# Patient Record
Sex: Female | Born: 1985
Health system: Southern US, Community
[De-identification: ages and names within clinical notes are randomized; demographics above are authoritative.]

## PROBLEM LIST (undated history)

## (undated) DIAGNOSIS — R7611 Nonspecific reaction to tuberculin skin test without active tuberculosis: Secondary | ICD-10-CM

## (undated) DIAGNOSIS — Z789 Other specified health status: Secondary | ICD-10-CM

## (undated) DIAGNOSIS — Z8489 Family history of other specified conditions: Secondary | ICD-10-CM

## (undated) HISTORY — DX: Nonspecific reaction to tuberculin skin test without active tuberculosis: R76.11

## (undated) HISTORY — DX: Family history of other specified conditions: Z84.89

## (undated) HISTORY — PX: NO PAST SURGERIES: SHX2092

## (undated) HISTORY — PX: CYSTECTOMY: SUR359

---
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
## 2004-02-24 ENCOUNTER — Ambulatory Visit: Payer: Self-pay | Admitting: Family Medicine

## 2004-06-23 ENCOUNTER — Ambulatory Visit: Payer: Self-pay | Admitting: Family Medicine

## 2004-08-19 ENCOUNTER — Emergency Department (HOSPITAL_COMMUNITY): Admission: EM | Admit: 2004-08-19 | Discharge: 2004-08-19 | Payer: Self-pay | Admitting: Emergency Medicine

## 2004-08-20 ENCOUNTER — Ambulatory Visit (HOSPITAL_COMMUNITY): Admission: RE | Admit: 2004-08-20 | Discharge: 2004-08-20 | Payer: Self-pay | Admitting: Emergency Medicine

## 2004-09-01 ENCOUNTER — Ambulatory Visit: Payer: Self-pay | Admitting: Family Medicine

## 2004-09-25 ENCOUNTER — Emergency Department (HOSPITAL_COMMUNITY): Admission: EM | Admit: 2004-09-25 | Discharge: 2004-09-25 | Payer: Self-pay | Admitting: Family Medicine

## 2004-09-26 ENCOUNTER — Ambulatory Visit: Payer: Self-pay | Admitting: Family Medicine

## 2004-11-13 ENCOUNTER — Emergency Department (HOSPITAL_COMMUNITY): Admission: EM | Admit: 2004-11-13 | Discharge: 2004-11-13 | Payer: Self-pay | Admitting: Family Medicine

## 2005-01-02 ENCOUNTER — Ambulatory Visit: Payer: Self-pay | Admitting: *Deleted

## 2005-04-03 ENCOUNTER — Ambulatory Visit: Payer: Self-pay | Admitting: Obstetrics & Gynecology

## 2005-04-04 ENCOUNTER — Ambulatory Visit (HOSPITAL_COMMUNITY): Admission: RE | Admit: 2005-04-04 | Discharge: 2005-04-04 | Payer: Self-pay | Admitting: *Deleted

## 2005-04-09 ENCOUNTER — Encounter (INDEPENDENT_AMBULATORY_CARE_PROVIDER_SITE_OTHER): Payer: Self-pay | Admitting: *Deleted

## 2005-04-09 LAB — CONVERTED CEMR LAB

## 2005-05-12 ENCOUNTER — Emergency Department (HOSPITAL_COMMUNITY): Admission: EM | Admit: 2005-05-12 | Discharge: 2005-05-12 | Payer: Self-pay | Admitting: Emergency Medicine

## 2005-05-22 ENCOUNTER — Ambulatory Visit: Payer: Self-pay | Admitting: Sports Medicine

## 2005-06-07 ENCOUNTER — Ambulatory Visit: Payer: Self-pay | Admitting: Family Medicine

## 2005-09-07 ENCOUNTER — Ambulatory Visit: Payer: Self-pay | Admitting: Family Medicine

## 2005-09-20 ENCOUNTER — Ambulatory Visit: Payer: Self-pay | Admitting: Family Medicine

## 2005-10-16 ENCOUNTER — Emergency Department (HOSPITAL_COMMUNITY): Admission: EM | Admit: 2005-10-16 | Discharge: 2005-10-16 | Payer: Self-pay | Admitting: Family Medicine

## 2005-11-05 ENCOUNTER — Ambulatory Visit: Payer: Self-pay | Admitting: Sports Medicine

## 2006-06-07 ENCOUNTER — Encounter (INDEPENDENT_AMBULATORY_CARE_PROVIDER_SITE_OTHER): Payer: Self-pay | Admitting: *Deleted

## 2006-10-19 ENCOUNTER — Emergency Department (HOSPITAL_COMMUNITY): Admission: EM | Admit: 2006-10-19 | Discharge: 2006-10-19 | Payer: Self-pay | Admitting: Emergency Medicine

## 2006-10-28 ENCOUNTER — Emergency Department (HOSPITAL_COMMUNITY): Admission: EM | Admit: 2006-10-28 | Discharge: 2006-10-28 | Payer: Self-pay | Admitting: Emergency Medicine

## 2006-12-19 ENCOUNTER — Telehealth (INDEPENDENT_AMBULATORY_CARE_PROVIDER_SITE_OTHER): Payer: Self-pay | Admitting: *Deleted

## 2006-12-20 ENCOUNTER — Ambulatory Visit: Payer: Self-pay | Admitting: Family Medicine

## 2006-12-20 ENCOUNTER — Encounter: Payer: Self-pay | Admitting: Family Medicine

## 2006-12-20 ENCOUNTER — Telehealth: Payer: Self-pay | Admitting: *Deleted

## 2006-12-20 LAB — CONVERTED CEMR LAB
Beta hcg, urine, semiquantitative: NEGATIVE
Bilirubin Urine: NEGATIVE
Blood in Urine, dipstick: NEGATIVE
GC Probe Amp, Genital: NEGATIVE
Glucose, Urine, Semiquant: NEGATIVE
KOH Prep: NEGATIVE
Nitrite: NEGATIVE
Specific Gravity, Urine: 1.02
pH: 7

## 2006-12-27 ENCOUNTER — Telehealth (INDEPENDENT_AMBULATORY_CARE_PROVIDER_SITE_OTHER): Payer: Self-pay | Admitting: *Deleted

## 2007-02-22 ENCOUNTER — Telehealth (INDEPENDENT_AMBULATORY_CARE_PROVIDER_SITE_OTHER): Payer: Self-pay | Admitting: Family Medicine

## 2007-02-24 ENCOUNTER — Ambulatory Visit: Payer: Self-pay | Admitting: Family Medicine

## 2007-02-24 ENCOUNTER — Encounter (INDEPENDENT_AMBULATORY_CARE_PROVIDER_SITE_OTHER): Payer: Self-pay | Admitting: Family Medicine

## 2007-02-24 ENCOUNTER — Telehealth: Payer: Self-pay | Admitting: *Deleted

## 2007-02-24 DIAGNOSIS — N920 Excessive and frequent menstruation with regular cycle: Secondary | ICD-10-CM | POA: Insufficient documentation

## 2007-02-24 LAB — CONVERTED CEMR LAB
Beta hcg, urine, semiquantitative: NEGATIVE
Chlamydia, DNA Probe: NEGATIVE
GC Probe Amp, Genital: NEGATIVE
Glucose, Urine, Semiquant: NEGATIVE
Ketones, urine, test strip: NEGATIVE
Nitrite: NEGATIVE
Platelets: 241 10*3/uL (ref 150–400)
RDW: 13 % (ref 11.5–15.5)
Specific Gravity, Urine: 1.02
WBC: 5.4 10*3/uL (ref 4.0–10.5)
pH: 7

## 2007-03-13 ENCOUNTER — Ambulatory Visit: Payer: Self-pay | Admitting: Family Medicine

## 2007-03-17 ENCOUNTER — Ambulatory Visit (HOSPITAL_COMMUNITY): Admission: RE | Admit: 2007-03-17 | Discharge: 2007-03-17 | Payer: Self-pay | Admitting: Family Medicine

## 2007-03-18 ENCOUNTER — Telehealth (INDEPENDENT_AMBULATORY_CARE_PROVIDER_SITE_OTHER): Payer: Self-pay | Admitting: Family Medicine

## 2007-03-20 ENCOUNTER — Ambulatory Visit: Payer: Self-pay | Admitting: Family Medicine

## 2007-03-26 ENCOUNTER — Telehealth: Payer: Self-pay | Admitting: *Deleted

## 2007-05-09 ENCOUNTER — Telehealth: Payer: Self-pay | Admitting: *Deleted

## 2007-05-14 ENCOUNTER — Ambulatory Visit: Payer: Self-pay | Admitting: Obstetrics & Gynecology

## 2007-06-30 ENCOUNTER — Encounter: Payer: Self-pay | Admitting: Obstetrics & Gynecology

## 2007-06-30 ENCOUNTER — Ambulatory Visit: Payer: Self-pay | Admitting: Obstetrics & Gynecology

## 2007-06-30 ENCOUNTER — Ambulatory Visit (HOSPITAL_COMMUNITY): Admission: RE | Admit: 2007-06-30 | Discharge: 2007-06-30 | Payer: Self-pay | Admitting: Obstetrics & Gynecology

## 2007-07-07 ENCOUNTER — Inpatient Hospital Stay (HOSPITAL_COMMUNITY): Admission: AD | Admit: 2007-07-07 | Discharge: 2007-07-07 | Payer: Self-pay | Admitting: Gynecology

## 2007-08-11 ENCOUNTER — Ambulatory Visit: Payer: Self-pay | Admitting: Sports Medicine

## 2007-08-11 DIAGNOSIS — N840 Polyp of corpus uteri: Secondary | ICD-10-CM | POA: Insufficient documentation

## 2007-08-11 DIAGNOSIS — L259 Unspecified contact dermatitis, unspecified cause: Secondary | ICD-10-CM | POA: Insufficient documentation

## 2007-08-11 DIAGNOSIS — L708 Other acne: Secondary | ICD-10-CM | POA: Insufficient documentation

## 2007-09-04 ENCOUNTER — Ambulatory Visit: Payer: Self-pay | Admitting: Obstetrics & Gynecology

## 2007-09-09 ENCOUNTER — Emergency Department (HOSPITAL_COMMUNITY): Admission: EM | Admit: 2007-09-09 | Discharge: 2007-09-09 | Payer: Self-pay | Admitting: Family Medicine

## 2007-09-18 ENCOUNTER — Emergency Department (HOSPITAL_COMMUNITY): Admission: EM | Admit: 2007-09-18 | Discharge: 2007-09-18 | Payer: Self-pay | Admitting: Emergency Medicine

## 2007-09-30 ENCOUNTER — Encounter: Payer: Self-pay | Admitting: *Deleted

## 2007-10-08 ENCOUNTER — Encounter: Payer: Self-pay | Admitting: Family Medicine

## 2007-10-08 ENCOUNTER — Ambulatory Visit: Payer: Self-pay | Admitting: Family Medicine

## 2008-01-30 ENCOUNTER — Ambulatory Visit: Payer: Self-pay | Admitting: Family Medicine

## 2008-01-31 ENCOUNTER — Inpatient Hospital Stay (HOSPITAL_COMMUNITY): Admission: AD | Admit: 2008-01-31 | Discharge: 2008-02-01 | Payer: Self-pay | Admitting: Obstetrics & Gynecology

## 2008-02-06 ENCOUNTER — Ambulatory Visit: Payer: Self-pay | Admitting: Obstetrics & Gynecology

## 2008-03-18 ENCOUNTER — Ambulatory Visit (HOSPITAL_COMMUNITY): Admission: RE | Admit: 2008-03-18 | Discharge: 2008-03-18 | Payer: Self-pay | Admitting: Obstetrics and Gynecology

## 2008-05-05 ENCOUNTER — Telehealth (INDEPENDENT_AMBULATORY_CARE_PROVIDER_SITE_OTHER): Payer: Self-pay | Admitting: *Deleted

## 2008-05-14 ENCOUNTER — Telehealth: Payer: Self-pay | Admitting: *Deleted

## 2008-05-24 ENCOUNTER — Encounter: Payer: Self-pay | Admitting: Family Medicine

## 2008-05-24 ENCOUNTER — Ambulatory Visit: Payer: Self-pay | Admitting: Family Medicine

## 2008-05-25 ENCOUNTER — Encounter: Payer: Self-pay | Admitting: Family Medicine

## 2008-05-26 ENCOUNTER — Ambulatory Visit: Payer: Self-pay | Admitting: Family Medicine

## 2008-05-26 ENCOUNTER — Encounter: Payer: Self-pay | Admitting: Family Medicine

## 2008-05-26 DIAGNOSIS — R7611 Nonspecific reaction to tuberculin skin test without active tuberculosis: Secondary | ICD-10-CM | POA: Insufficient documentation

## 2008-05-26 HISTORY — DX: Nonspecific reaction to tuberculin skin test without active tuberculosis: R76.11

## 2008-05-26 LAB — CONVERTED CEMR LAB: Varicella IgG: 2.66 — ABNORMAL HIGH

## 2008-05-31 ENCOUNTER — Encounter: Admission: RE | Admit: 2008-05-31 | Discharge: 2008-05-31 | Payer: Self-pay | Admitting: Pulmonary Disease

## 2008-10-04 IMAGING — CR DG CHEST 2V
2 series · 2 of 2 positions shown · non-contrast
Comparison: No priors available.

CLINICAL DATA: 20-year-old with chest pain for one week.  
 CHEST ? 2 VIEW:

[w chest pa]
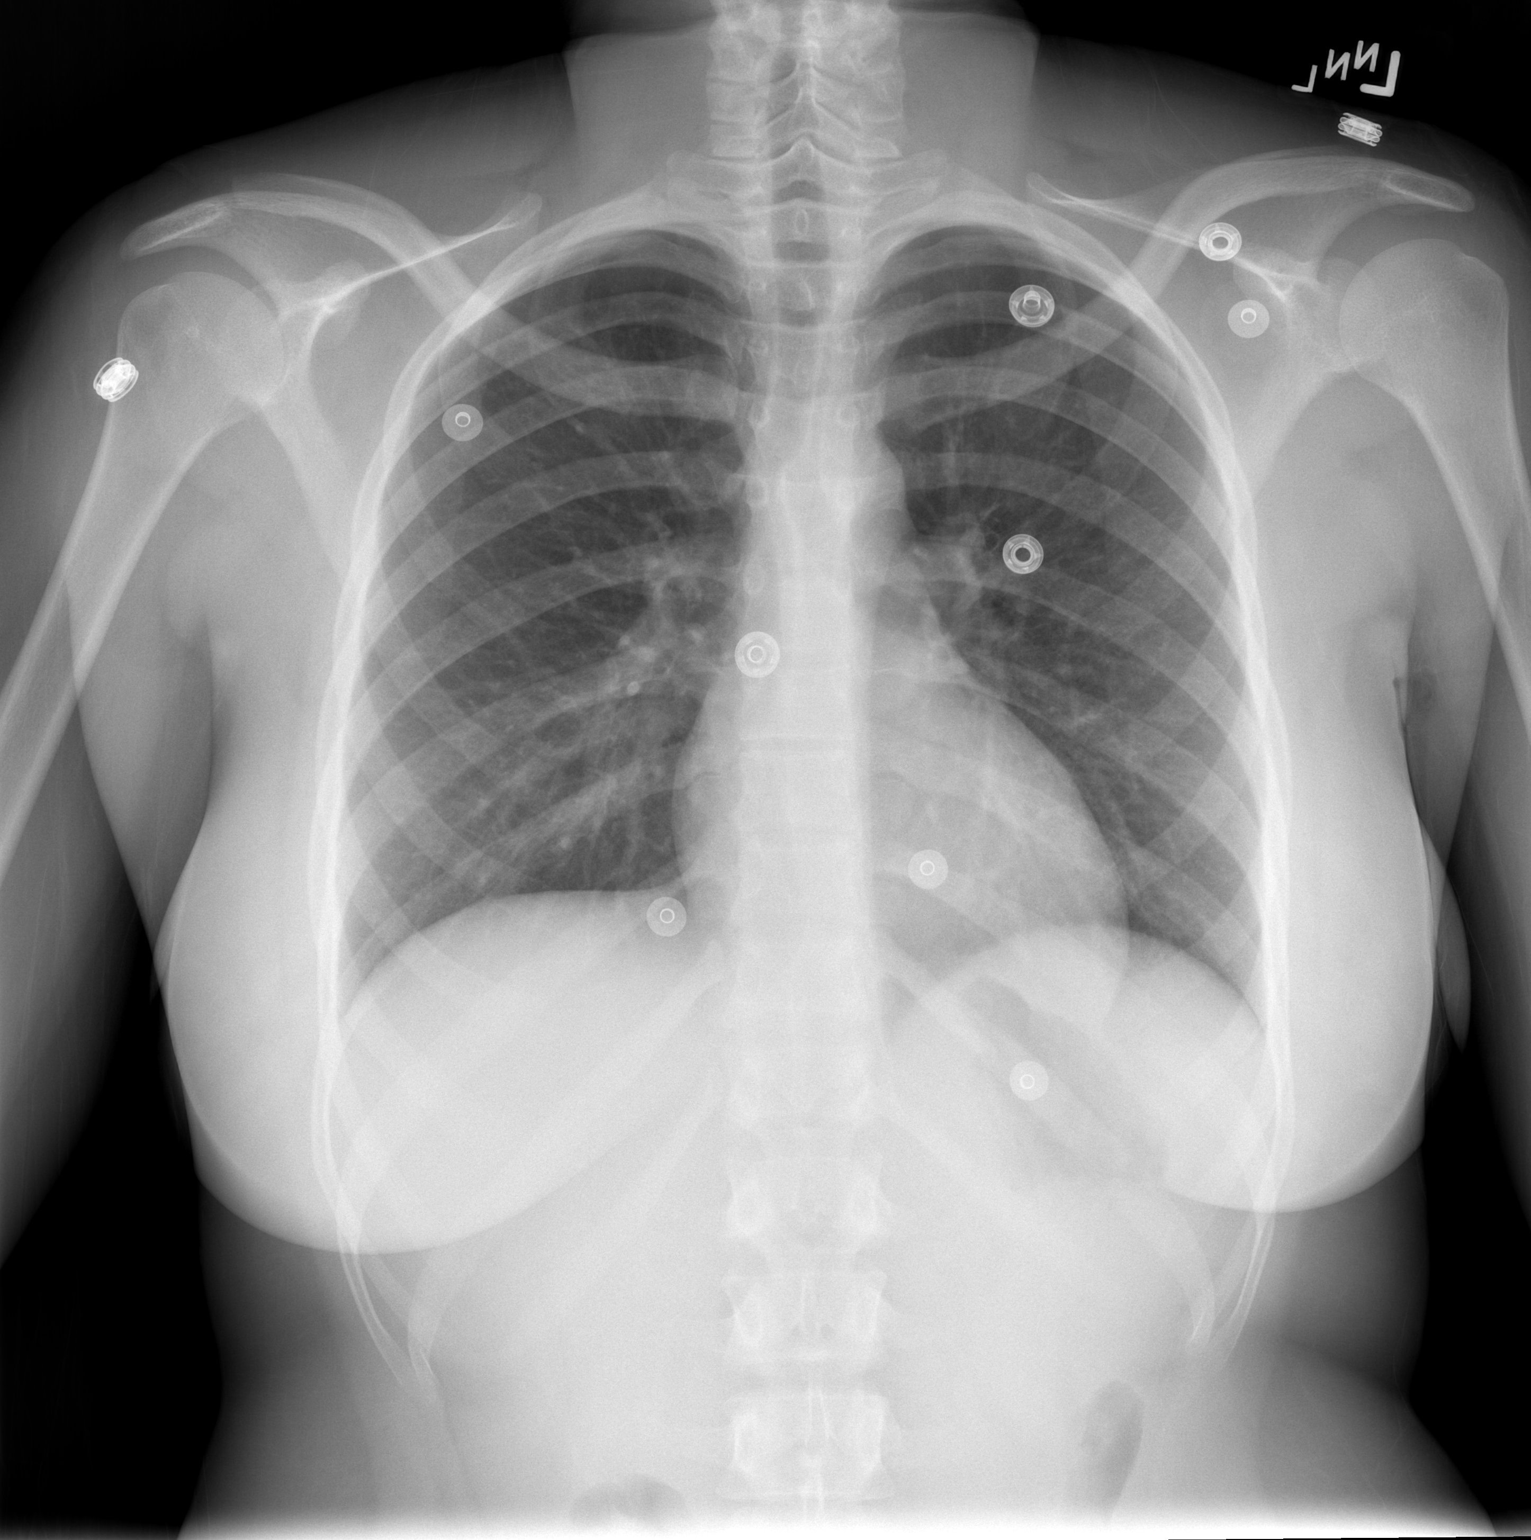

[w chest lat]
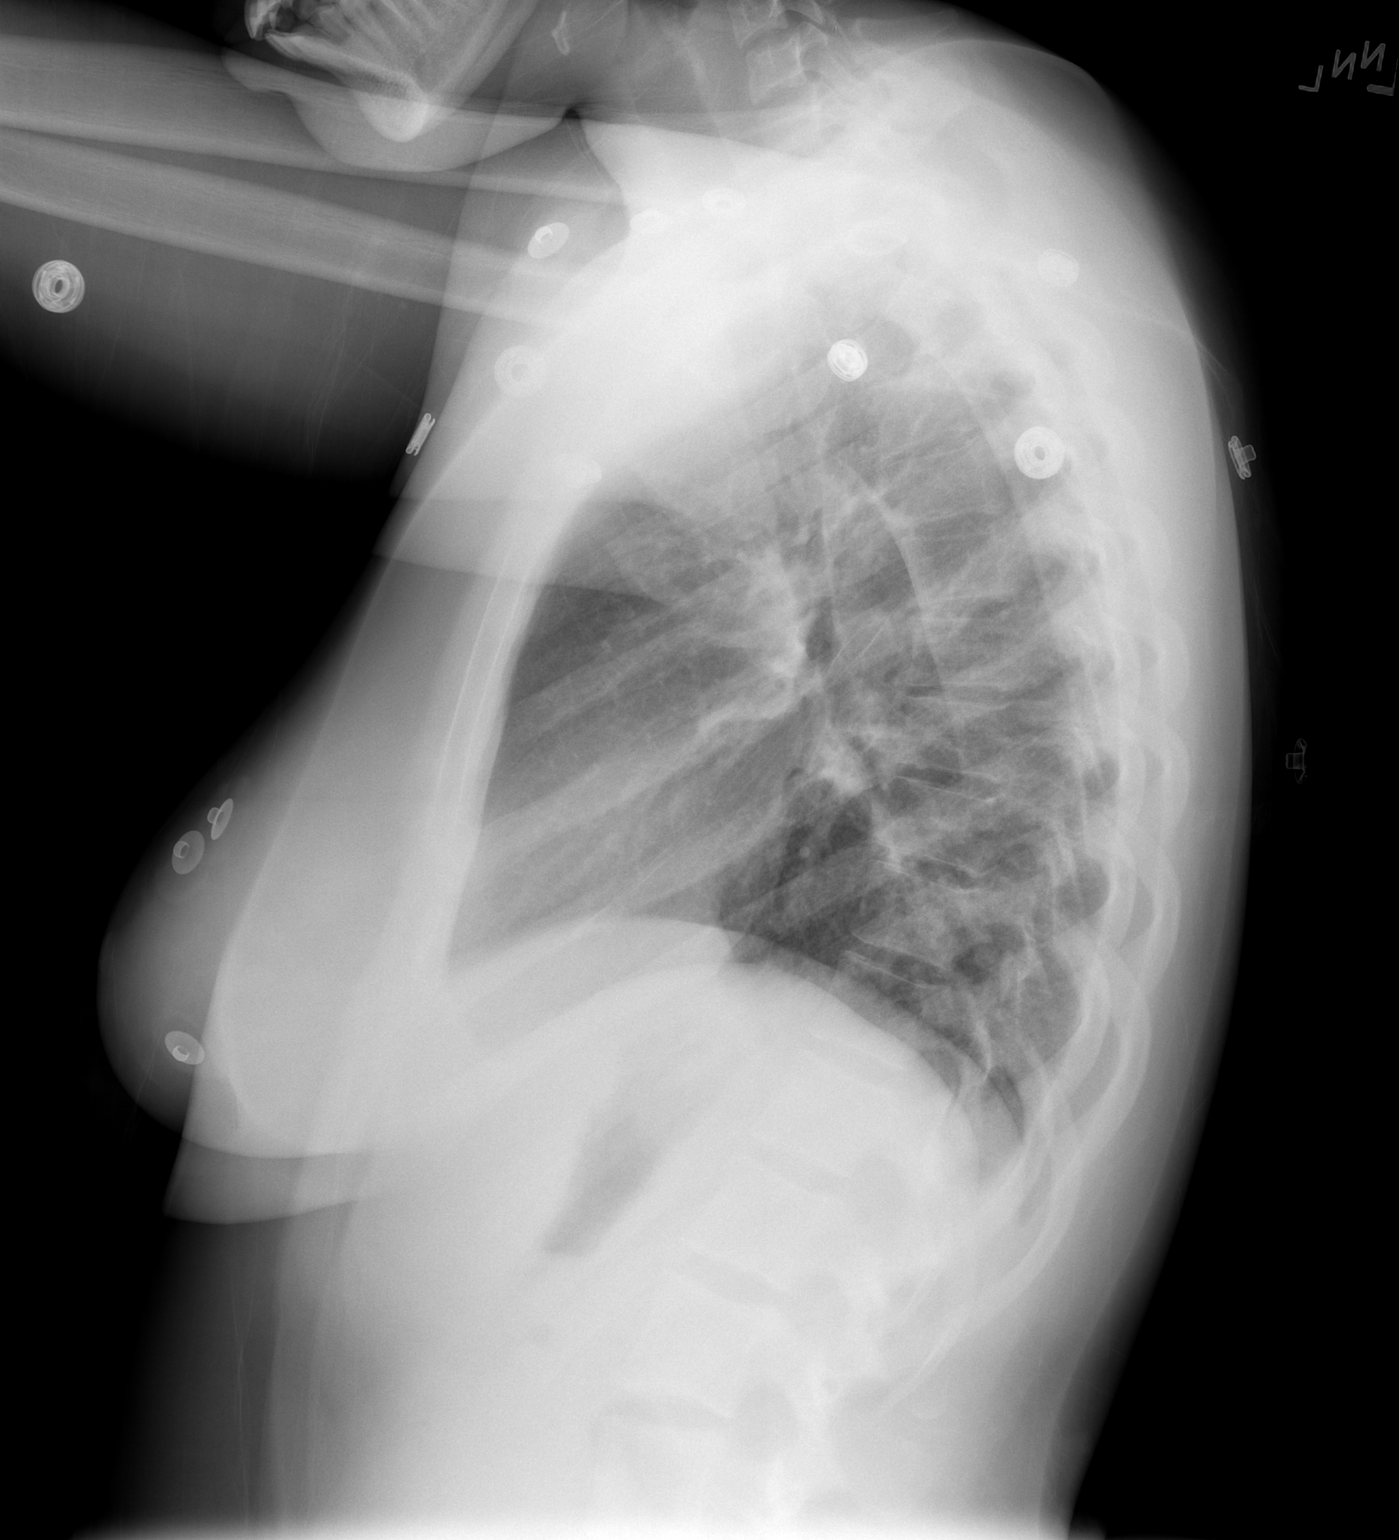

[2 of 2 positions shown; findings below may reference images not displayed]

FINDINGS: There is mild prominence of interstitial markings in the perihilar region consistent with bronchitic changes.  No focal consolidations or pleural effusions are identified.
IMPRESSION: Mild bronchitic changes.

## 2008-10-19 ENCOUNTER — Encounter: Payer: Self-pay | Admitting: Family Medicine

## 2009-01-05 ENCOUNTER — Telehealth: Payer: Self-pay | Admitting: Family Medicine

## 2009-01-05 ENCOUNTER — Ambulatory Visit: Payer: Self-pay | Admitting: Family Medicine

## 2009-01-10 ENCOUNTER — Telehealth (INDEPENDENT_AMBULATORY_CARE_PROVIDER_SITE_OTHER): Payer: Self-pay | Admitting: *Deleted

## 2009-03-07 ENCOUNTER — Ambulatory Visit: Payer: Self-pay | Admitting: Family Medicine

## 2009-03-29 ENCOUNTER — Emergency Department (HOSPITAL_COMMUNITY): Admission: EM | Admit: 2009-03-29 | Discharge: 2009-03-29 | Payer: Self-pay | Admitting: Family Medicine

## 2009-04-10 ENCOUNTER — Emergency Department (HOSPITAL_COMMUNITY): Admission: EM | Admit: 2009-04-10 | Discharge: 2009-04-10 | Payer: Self-pay | Admitting: Emergency Medicine

## 2009-04-12 ENCOUNTER — Telehealth: Payer: Self-pay | Admitting: Family Medicine

## 2009-04-15 ENCOUNTER — Ambulatory Visit: Payer: Self-pay | Admitting: Family Medicine

## 2009-06-20 ENCOUNTER — Encounter: Payer: Self-pay | Admitting: Family Medicine

## 2009-07-09 ENCOUNTER — Emergency Department (HOSPITAL_COMMUNITY): Admission: EM | Admit: 2009-07-09 | Discharge: 2009-07-09 | Payer: Self-pay | Admitting: Emergency Medicine

## 2009-07-15 ENCOUNTER — Ambulatory Visit: Payer: Self-pay | Admitting: Family Medicine

## 2010-02-15 ENCOUNTER — Telehealth: Payer: Self-pay | Admitting: *Deleted

## 2010-02-16 ENCOUNTER — Ambulatory Visit: Payer: Self-pay | Admitting: Family Medicine

## 2010-02-16 LAB — CONVERTED CEMR LAB
Bilirubin Urine: NEGATIVE
Glucose, Urine, Semiquant: NEGATIVE
Specific Gravity, Urine: 1.025
pH: 6

## 2010-03-08 ENCOUNTER — Encounter (INDEPENDENT_AMBULATORY_CARE_PROVIDER_SITE_OTHER): Payer: Self-pay | Admitting: *Deleted

## 2010-04-05 ENCOUNTER — Ambulatory Visit: Admit: 2010-04-05 | Payer: Self-pay

## 2010-05-09 NOTE — Assessment & Plan Note (Signed)
Summary: uti,tcb   Vital Signs:  Patient profile:   25 year old female Weight:      154 pounds Temp:     98.9 degrees F oral Pulse rate:   93 / minute Pulse rhythm:   regular BP sitting:   117 / 78  (left arm) Cuff size:   regular  Vitals Entered By: Loralee Pacas CMA (July 15, 2009 1:44 PM)  Acute Pediatric Visit History:      The patient presents with genitourinary symptoms.  The urinary symptoms has been present for 5 days.  She notes a history of abdominal cramping, dysuria, frequency, and urgency.  She denies abdominal pain, fever, flank pain, or hematuria.  The patient has no history of pyelonephritis, kidney stones, a renal anomaly, renal disease, diabetes, ectopic pregnancy, pelvic inflammatory disease, sexually transmitted disease, immunosuppression, orl contraceptive agent use, or, IUD usage.  Antibiotics have not been used within the last 4 weeks.  She has not had 3 or more urinary tract infections in the last 12 months.  Comments: feeling of incomplete voiding. no systemic symptoms. .        Current Medications (verified): 1)  Ibuprofen 800 Mg  Tabs (Ibuprofen) .Marland Kitchen.. 1 By Mouth Three Times A Day With Food As Needed Cramps 2)  Triamcinolone Acetonide 0.1 %  Oint (Triamcinolone Acetonide) .... Aaa Three Times A Day As Needed Itching 3)  Minocycline Hcl 100 Mg  Caps (Minocycline Hcl) .Marland Kitchen.. 1 By Mouth Two Times A Day For 12 Weeks 4)  Cleocin-T 1 % Lotn (Clindamycin Phosphate) .... Apply To Affected Areas On Face Two Times A Day X 6 Weeks. Dispense 60 Ml 5)  Sumatriptan Succinate 50 Mg Tabs (Sumatriptan Succinate) .... One Tab By Mouth At Once of Headache. May Repeat Every 2 Hours Up To Max of 4 Tablets in 24 Hours.  Allergies (verified): No Known Drug Allergies  Physical Exam  General:  Well-developed,well-nourished,in no acute distress; alert,appropriate and cooperative throughout examination. vitals reviewed.  Abdomen:  Bowel sounds positive,abdomen soft and non-tender without  masses, organomegaly or hernias noted. no CVA tenderness bilaterally   Impression & Recommendations:  Problem # 1:  UTI (ICD-599.0) Assessment New  U/A with LE in addition to patient history. treat with cipro x3 days.   Her updated medication list for this problem includes:    Minocycline Hcl 100 Mg Caps (Minocycline hcl) .Marland Kitchen... 1 by mouth two times a day for 12 weeks    Ciprofloxacin Hcl 500 Mg Tabs (Ciprofloxacin hcl) ..... One tab by mouth two times a day x3 days  Orders: [facility]- Est Level  3 (81191)  Other Orders: Urinalysis-[facility] (00000) Prescriptions: CIPROFLOXACIN HCL 500 MG TABS (CIPROFLOXACIN HCL) one tab by mouth two times a day x3 days  #6 x 0   Entered and Authorized by:   Lequita Asal  MD   Signed by:   Lequita Asal  MD on 07/15/2009   Method used:   Electronically to        [facility] #[id number]* (retail)       [address] [place], [place]  [address]       Ph: [phone]       Fax: [phone]   RxID:   [id number]   Appended Document: urine report    Lab Visit  Laboratory Results   Urine Tests  Date/Time Received: July 15, 2009 1:44 PM  Date/Time Reported: July 15, 2009 2:40 PM  Routine Urinalysis   Color: yellow Appearance: Clear Glucose: negative   (Normal Range: Negative) Bilirubin: negative   (Normal Range: Negative) Ketone: negative   (Normal Range: Negative) Spec. Gravity: 1.015   (Normal Range: 1.003-1.035) Blood: negative   (Normal Range: Negative) pH: 7.0   (Normal Range: 5.0-8.0) Protein: negative   (Normal Range: Negative) Urobilinogen: 0.2   (Normal Range: 0-1) Nitrite: negative   (Normal Range: Negative) Leukocyte Esterace: small   (Normal Range: Negative)  Urine Microscopic WBC/HPF: 1-5 Bacteria/HPF: 1+ Epithelial/HPF: 1-5    Comments: ...............test performed by......Marland KitchenBonnie A. [place], MLS (ASCP)cm    Orders Today:

## 2010-05-09 NOTE — Progress Notes (Signed)
Summary: triage  Phone Note Call from Patient Call back at Home Phone [phone]   Caller: Patient Summary of Call: pt went to ED on Sunday for migrain - having problems sleeing and went to sleep last night and just woke up - wanted to see doctor today b/c she missed class and is not sure if she even go tomorrow - appt w/ Treanna Dumler on Friday needs to talk to nurse about getting a note for today Initial call taken by: De Nurse,  April 12, 2009 1:58 PM  Follow-up for Phone Call        states she just woke up & still has a slight HA. took excedrin. offered 3pm workin today or tomorrow at 8:30. told her we cannot write a note if she was not seen in clinic. only on day seen. she declined. states if she still has HA she will call at 8:30 to come in. still has appt fri with pcp Follow-up by: Golden Circle RN,  April 12, 2009 2:03 PM

## 2010-05-09 NOTE — Progress Notes (Signed)
Summary: wi req  Phone Note Call from Patient Call back at Home Phone [phone] Call back at [phone]   Reason for Call: Talk to Nurse Summary of Call: pt is req wi appt for tom. am at 8:30, pt is having uti symptoms, sts she just rec abx from [place] 3 weeks ago for uti but pt did not finish them. Initial call taken by: Knox Royalty,  February 15, 2010 4:38 PM  Follow-up for Phone Call        Went to [facility] over 3 week ago for a UTI.  Was prescribed an antibiotic but did not take them all due to causing nausea.  Pt is still symptomatic and also thinks she may have a vaginal yeast infection.  Used OTC Monistat this am which has helped some.  Gave her a [place] appt for tomorrow am.  Told her to bring the bottle of the antibiotic she was prescribed by [facility]. Follow-up by: Dennison Nancy RN,  February 15, 2010 4:47 PM

## 2010-05-09 NOTE — Miscellaneous (Signed)
Summary: ROI  ROI   Imported By: Bradly Bienenstock 06/20/2009 09:25:24  _____________________________________________________________________  External Attachment:    Type:   Image     Comment:   External Document

## 2010-05-09 NOTE — Assessment & Plan Note (Signed)
Summary: F/U headaches   Vital Signs:  Patient profile:   25 year old female Height:      65 inches Weight:      153.8 pounds BMI:     25.69 Temp:     98.1 degrees F oral Pulse rate:   86 / minute BP sitting:   112 / 75  (right arm) Cuff size:   regular  Vitals Entered By: Gladstone Pih (April 15, 2009 10:06 AM) CC: F/U headaches Is Patient Diabetic? No Pain Assessment Patient in pain? no        Primary Care Provider:  Lequita Asal  MD  CC:  F/U headaches.  History of Present Illness: patient 25 y/o female here c/o headaches  headaches- h/o headache typically at start of menses. over last 4 weeks reports headaches most days during week. no associated nausea, vomiting, slurred speech, numbness, tingling or other neuro findings. some associated photophobia. usually left sided headache. some improvement with 800 mg of ibuprofen two times a day. no relief with tylenol. some relief with excedrin. of note, patient was drinking up to 8 cups of coffee daily, and quit cold [place]  ~3 weeks ago.   Habits & Providers  Alcohol-Tobacco-Diet     Tobacco Status: never     Tobacco Counseling: not indicated; no tobacco use  Current Medications (verified): 1)  Ibuprofen 800 Mg  Tabs (Ibuprofen) .Marland Kitchen.. 1 By Mouth Three Times A Day With Food As Needed Cramps 2)  Triamcinolone Acetonide 0.1 %  Oint (Triamcinolone Acetonide) .... Aaa Three Times A Day As Needed Itching 3)  Minocycline Hcl 100 Mg  Caps (Minocycline Hcl) .Marland Kitchen.. 1 By Mouth Two Times A Day For 12 Weeks 4)  Cleocin-T 1 % Lotn (Clindamycin Phosphate) .... Apply To Affected Areas On Face Two Times A Day X 6 Weeks. Dispense 60 Ml 5)  Sumatriptan Succinate 50 Mg Tabs (Sumatriptan Succinate) .... One Tab By Mouth At Once of Headache. May Repeat Every 2 Hours Up To Max of 4 Tablets in 24 Hours.  Allergies (verified): No Known Drug Allergies  Physical Exam  General:  Well-developed,well-nourished,in no acute distress;  alert,appropriate and cooperative throughout examination. vitals reviewed.  Eyes:  No corneal or conjunctival inflammation noted. EOMI. Perrla. Funduscopic exam benign, without hemorrhages, exudates or papilledema. Vision grossly normal. Lungs:  Normal respiratory effort, chest expands symmetrically. Lungs are clear to auscultation, no crackles or wheezes. Heart:  Normal rate and regular rhythm. S1 and S2 normal without gallop, murmur, click, rub or other extra sounds. Neurologic:  No cranial nerve deficits noted. Station and gait are normal. DTRs are symmetrical throughout. Sensory, motor and coordinative functions appear intact.   Impression & Recommendations:  Problem # 1:  HEADACHE (ICD-784.0) Assessment Deteriorated  likely migraine vs. caffeine withdrawal. recommend resuming small amount of coffee daily and doing slow taper. also given rx for sumatriptan. patient given option of trying coffee-wean approach, or obtaining medication and doing med trial. since uninsured, she plans on trying options with coffee first with excedrin migraine, which contains caffeine as well. Misty Stanley, pharmacy student, will call and f/u with patient in 1 week.   Her updated medication list for this problem includes:    Ibuprofen 800 Mg Tabs (Ibuprofen) .Marland Kitchen... 1 by mouth three times a day with food as needed cramps    Sumatriptan Succinate 50 Mg Tabs (Sumatriptan succinate) ..... One tab by mouth at once of headache. may repeat every 2 hours up to max of 4 tablets in 24  hours.  Orders: [facility]- Est Level  3 (04540)  Complete Medication List: 1)  Ibuprofen 800 Mg Tabs (Ibuprofen) .Marland Kitchen.. 1 by mouth three times a day with food as needed cramps 2)  Triamcinolone Acetonide 0.1 % Oint (Triamcinolone acetonide) .... Aaa three times a day as needed itching 3)  Minocycline Hcl 100 Mg Caps (Minocycline hcl) .Marland Kitchen.. 1 by mouth two times a day for 12 weeks 4)  Cleocin-t 1 % Lotn (Clindamycin phosphate) .... Apply to affected areas on  face two times a day x 6 weeks. dispense 60 ml 5)  Sumatriptan Succinate 50 Mg Tabs (Sumatriptan succinate) .... One tab by mouth at once of headache. may repeat every 2 hours up to max of 4 tablets in 24 hours.  Patient Instructions: 1)  Follow up with Dr. Lanier Prude in 4-6 weeks about headaches. 2)  You should take the new medication (sumatriptan) right when your headache starts.  Prescriptions: SUMATRIPTAN SUCCINATE 50 MG TABS (SUMATRIPTAN SUCCINATE) one tab by mouth at once of headache. may repeat every 2 hours up to max of 4 tablets in 24 hours.  #9 x 2   Entered and Authorized by:   Lequita Asal  MD   Signed by:   Lequita Asal  MD on 04/15/2009   Method used:   Electronically to        [facility] #[id number]* (retail)       [address] [place], [place]  [address]       Ph: [phone]       Fax: [phone]   RxID:   [id number]

## 2010-05-09 NOTE — Miscellaneous (Signed)
Summary: medical record request  Clinical Lists Changes  Rec'd medical record request to go to [facility] faxed 12/09/09 Marily Memos  March 08, 2010 10:19 AM

## 2010-05-09 NOTE — Assessment & Plan Note (Signed)
Summary: UTI and ? Yeast Inf/kf   Vital Signs:  Patient profile:   25 year old female Height:      65 inches Weight:      156.50 pounds BMI:     26.14 BSA:     1.78 Temp:     98.4 degrees F Pulse rate:   77 / minute BP sitting:   129 / 74  Vitals Entered By: Jone Baseman CMA (February 16, 2010 8:49 AM) CC: UTI and Yeast infection Is Patient Diabetic? No Pain Assessment Patient in pain? no        Primary Care Provider:  Lequita Asal  MD  CC:  UTI and Yeast infection.  History of Present Illness: 1. UTI:  Pt was diagnosed with a UTI about 3 weeks ago.  She took Cefdinir but didn't take is as directed.  She skipped a couple of days.  However, her symptoms resolved and she did well up until yesterday.  Yesterday she noticed some burning and pain with urination.  The symptoms have persisted through today.  ROS: denies fevers, hematuria, or back pain  2. Yeast infection:  Yesterday she also noticed some symptoms consistent with a yeast infection.  She has had a yeast infection before and this feels like that.  She had some vaginal itching and some discharge.  She tried OTC monistat which might have helped a little bit.  ROS: denies abdominal pain  Habits & Providers  Alcohol-Tobacco-Diet     Tobacco Status: never     Tobacco Counseling: not indicated; no tobacco use  Allergies: No Known Drug Allergies  Past History:  Past Medical History: Reviewed history from 06/06/2006 and no changes required. Chronic Right Flank Pain  Physical Exam  General:  Vitals reviewed.  Well appearing.  No acute distress. Lungs:  normal respiratory effort.   Heart:  normal rate and regular rhythm.   Abdomen:  soft, non-tender, normal bowel sounds, no guarding, and no rebound tenderness.   Genitalia:  normal introitus, no external lesions, mucosa pink and moist, no vaginal or cervical lesions, and vaginal discharge.   Skin:  no rashes and no suspicious lesions.   Psych:  not  anxious appearing and not depressed appearing.     Impression & Recommendations:  Problem # 1:  DYSURIA (ICD-788.1) Assessment New ? UTI.  + symptoms and mod LE.  Will treat with Keflex. Her updated medication list for this problem includes:    Minocycline Hcl 100 Mg Caps (Minocycline hcl) .Marland Kitchen... 1 by mouth two times a day for 12 weeks    Keflex 500 Mg Caps (Cephalexin) .Marland Kitchen... 1 tab by mouth twice a day for 7 days  Orders: Urinalysis-[facility] (00000) [facility]- Est  Level 4 (65784)  Problem # 2:  UNSPECIFIED VAGINITIS AND VULVOVAGINITIS (ICD-616.10) Assessment: New + yeast on wet prep.  Treat with Fluconazole by mouth times 1. Her updated medication list for this problem includes:    Minocycline Hcl 100 Mg Caps (Minocycline hcl) .Marland Kitchen... 1 by mouth two times a day for 12 weeks    Keflex 500 Mg Caps (Cephalexin) .Marland Kitchen... 1 tab by mouth twice a day for 7 days  Orders: Wet Prep- [facility] (87210) [facility]- Est  Level 4 (69629)  Complete Medication List: 1)  Ibuprofen 800 Mg Tabs (Ibuprofen) .Marland Kitchen.. 1 by mouth three times a day with food as needed cramps 2)  Triamcinolone Acetonide 0.1 % Oint (Triamcinolone acetonide) .... Aaa three times a day as needed itching 3)  Minocycline Hcl 100  Mg Caps (Minocycline hcl) .Marland Kitchen.. 1 by mouth two times a day for 12 weeks 4)  Cleocin-t 1 % Lotn (Clindamycin phosphate) .... Apply to affected areas on face two times a day x 6 weeks. dispense 60 ml 5)  Sumatriptan Succinate 50 Mg Tabs (Sumatriptan succinate) .... One tab by mouth at once of headache. may repeat every 2 hours up to max of 4 tablets in 24 hours. 6)  Keflex 500 Mg Caps (Cephalexin) .Marland Kitchen.. 1 tab by mouth twice a day for 7 days 7)  Fluconazole 150 Mg Tabs (Fluconazole) .Marland Kitchen.. 1 tab by mouth times 1  Patient Instructions: 1)  It looks like you probably have a UTI and a yeast infection 2)  I am going to treat you for both 3)  If not better in 7-10 days please return to clinic Prescriptions: FLUCONAZOLE 150 MG TABS  (FLUCONAZOLE) 1 tab by mouth times 1  #1 x 0   Entered and Authorized by:   Angelena Sole MD   Signed by:   Angelena Sole MD on 02/16/2010   Method used:   Electronically to        [facility] #[id number]* (retail)       [address] [place], [place]  [address]       Ph: [phone]       Fax: [phone]   RxID:   [id number] KEFLEX 500 MG CAPS (CEPHALEXIN) 1 tab by mouth twice a day for 7 days  #14 x 0   Entered and Authorized by:   Angelena Sole MD   Signed by:   Angelena Sole MD on 02/16/2010   Method used:   Electronically to        [facility] #[id number]* (retail)       [address] [place], [place]  [address]       Ph: [phone]       Fax: [phone]   RxID:   [id number]    Orders Added: 1)  Urinalysis-[facility] [00000] 2)  Wet Prep- [facility] [87210] 3)  [facility]- Est  Level 4 [16010]    Laboratory Results   Urine Tests  Date/Time Received: February 16, 2010 8:41 AM  Date/Time Reported: February 16, 2010 9:18 AM   Routine Urinalysis   Color: yellow Appearance: Clear Glucose: negative   (Normal Range: Negative) Bilirubin: negative   (Normal Range: Negative) Ketone: negative   (Normal Range: Negative) Spec. Gravity: 1.025   (Normal Range: 1.003-1.035) Blood: trace-intact   (Normal Range: Negative) pH: 6.0   (Normal Range: 5.0-8.0) Protein: negative   (Normal Range: Negative) Urobilinogen: 0.2   (Normal Range: 0-1) Nitrite: negative   (Normal Range: Negative) Leukocyte Esterace: moderate   (Normal Range: Negative)  Urine Microscopic WBC/HPF: 5-10 RBC/HPF: 0-3 Bacteria/HPF: 1+ Mucous/HPF: 2+ Epithelial/HPF: 1-5    Comments: ...............test performed by......Marland KitchenBonnie A. [place], MLS (ASCP)cm  Date/Time Received: February 16, 2010 8:41 AM  Date/Time Reported: February 16, 2010 9:48 AM   [place] Source: vag WBC/hpf: loaded Bacteria/hpf: 3+ rods and cocci Clue cells/hpf: none  Negative whiff Yeast/hpf:  many Trichomonas/hpf: none Comments: ...............test performed by......Marland KitchenBonnie A. [place], MLS (ASCP)cm

## 2010-05-10 ENCOUNTER — Encounter: Payer: Self-pay | Admitting: Family Medicine

## 2010-05-10 ENCOUNTER — Other Ambulatory Visit: Payer: Self-pay | Admitting: Family Medicine

## 2010-05-10 ENCOUNTER — Ambulatory Visit (INDEPENDENT_AMBULATORY_CARE_PROVIDER_SITE_OTHER): Payer: PRIVATE HEALTH INSURANCE | Admitting: Family Medicine

## 2010-05-10 DIAGNOSIS — R51 Headache: Secondary | ICD-10-CM

## 2010-05-10 DIAGNOSIS — H4710 Unspecified papilledema: Secondary | ICD-10-CM

## 2010-05-10 DIAGNOSIS — R519 Headache, unspecified: Secondary | ICD-10-CM | POA: Insufficient documentation

## 2010-05-10 LAB — CONVERTED CEMR LAB
CO2: 25 meq/L (ref 19–32)
Chloride: 103 meq/L (ref 96–112)
Glucose, Bld: 103 mg/dL — ABNORMAL HIGH (ref 70–99)
Potassium: 4.4 meq/L (ref 3.5–5.3)
Sodium: 138 meq/L (ref 135–145)

## 2010-05-15 ENCOUNTER — Other Ambulatory Visit: Payer: PRIVATE HEALTH INSURANCE

## 2010-05-17 NOTE — Assessment & Plan Note (Signed)
Summary: migrain,df   Vital Signs:  Patient profile:   25 year old female Height:      65 inches Weight:      159 pounds BMI:     26.55 Temp:     98.4 degrees F oral Pulse rate:   90 / minute BP sitting:   104 / 71  (left arm) Cuff size:   regular  Vitals Entered By: Tessie Fass CMA (May 10, 2010 2:02 PM) CC: migraine Pain Assessment Patient in pain? yes     Location: head Intensity: 5   Primary Care Provider:  Rodney Langton MD  CC:  migraine.  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 She notes a headache for the last 3 weeks. However she had a headache for months last year that resolved over the fall only to return worse 3 weeks ago.  Her headache is located behind her eyes BL and is squeezing. It will go away sometimes but occurs often. She denies any vision changes or focal neuro exam. She notes that she is fatigued today but she worked the night shift last night.  No fevers or chills.   Current Problems (verified): 1)  Papilledema  (ICD-377.00) 2)  Headache  (ICD-784.0) 3)  Tb Skin Test, Positive  (ICD-795.5) 4)  Eczema  (ICD-692.9) 5)  Acne, Mild  (ICD-706.1) 6)  Uterine Polyp  (ICD-621.0) 7)  Menorrhagia  (ICD-626.2) 8)  Health Screening  (ICD-V70.0)  Current Medications (verified): 1)  Ibuprofen 800 Mg  Tabs (Ibuprofen) .Marland Kitchen.. 1 By Mouth Three Times A Day With Food As Needed Cramps 2)  Triamcinolone Acetonide 0.1 %  Oint (Triamcinolone Acetonide) .... Aaa Three Times A Day As Needed Itching  Allergies (verified): No Known Drug Allergies  Past History:  Past Medical History: Last updated: 06/06/2006 Chronic Right Flank Pain  Past Surgical History: Last updated: 06/06/2006 Abd U/S - L Ovarian cyst 3.6cm - 09/01/2004, Abdominal/pelvic  CT neg - 11/08/2003  Family History: Last updated: 05/24/2008 Mother with Diabetes, Mother with renal calculi resulting in nephrectomy  Social History: Last updated: 05/24/2008 Lives with  her mother, 3 sisters, 2 brothers. She is the 2nd of nine children.  Her father is in [place]; She is originally from [place] and moved to the [place] in 1997; Mother is [place] (pt here); Denies EtOH, Tob, and drugs  Review of Systems  The patient denies anorexia, fever, weight loss, vision loss, chest pain, syncope, abdominal pain, and severe indigestion/heartburn.    Physical Exam  General:  Vs noted well NAD Head:  Normocephalic and atraumatic without obvious abnormalities. No apparent alopecia or balding. Eyes:  EOMI, PERRLA Fundascopic exam: Right eye: Blurring of the optic disk with ? cupping Left eye: Mild blurring less than right.  No hemorahages seen in either eye.  Lungs:  Normal respiratory effort, chest expands symmetrically. Lungs are clear to auscultation, no crackles or wheezes. Heart:  Normal rate and regular rhythm. S1 and S2 normal without gallop, murmur, click, rub or other extra sounds. Extremities:  Non edemetus bl le Neurologic:  No cranial nerve deficits noted. Station and gait are normal. Plantar reflexes are down-going bilaterally. DTRs are symmetrical throughout. Sensory, motor and coordinative functions appear intact. Psych:  anxious about her headaches and the planned workup.    Impression & Recommendations:  Problem # 1:  HEADACHE (ICD-784.0) Assessment New Concerning with PE finding of papledema. Her chronicitiy is also concerning.  Her HA is not typical for migraines.  My major concern is for pseudotumor ceberi vs intercranial mass.  Plan to eval this with a MRI with contrast.  If totally normal will consider LP with pressure measurement. If signs of pseudotumor ceberi on MRI will plan for directly treating with acetazolomide. Will follow up after MRI done. Wills ee if worse. Red flags reviewed.    The following medications were removed from the medication list:    Sumatriptan Succinate 50 Mg Tabs (Sumatriptan succinate) ..... One tab by mouth at once  of headache. may repeat every 2 hours up to max of 4 tablets in 24 hours. Her updated medication list for this problem includes:    Ibuprofen 800 Mg Tabs (Ibuprofen) .Marland Kitchen... 1 by mouth three times a day with food as needed cramps  Orders: MRI with Contrast (MRI w/Contrast) [facility]- Est  Level 4 (99214)Future Orders: Basic Met-[facility] ([address]) ... 06/07/2011  Complete Medication List: 1)  Ibuprofen 800 Mg Tabs (Ibuprofen) .Marland Kitchen.. 1 by mouth three times a day with food as needed cramps 2)  Triamcinolone Acetonide 0.1 % Oint (Triamcinolone acetonide) .... Aaa three times a day as needed itching  Patient Instructions: 1)  Thank you for seeing me today. 2)  Please go for your MRI. 3)  Come back in 2-4 weeks after MRI to talk about your results.  4)  Take tylenol or ibuprofen for headache.  5)  Try drinking gateroaid.  6)  We think you have increased pressure in your brain. This may be psueodtumor cebri. 7)  We saw paplidemia in your eyes.  8)  Look up more information on [email or web address] if you want to.    Orders Added: 1)  MRI with Contrast [MRI w/Contrast] 2)  Basic Met-[facility] [09811-91478] 3)  [facility]- Est  Level 4 [29562]

## 2010-06-24 LAB — POCT PREGNANCY, URINE: Preg Test, Ur: NEGATIVE

## 2010-08-22 NOTE — H&P (Signed)
NAME:  ERIAN, LARIVIERE NO.:  [id number]   MEDICAL RECORD NO.:  [id number]          PATIENT TYPE:  AMB   LOCATION:  [facility]                           FACILITY:  [facility]   PHYSICIAN:  Allie Bossier, MD        DATE OF BIRTH:  22-Jan-1986   DATE OF ADMISSION:  05/14/2007  DATE OF DISCHARGE:  05/14/2007                              HISTORY & PHYSICAL   Ms. Caraveo is a 25 year old, single, black, gravida 0 who is referred to  the GYN clinic from the family practice clinic with a complaint of a 9  month history of breakthrough bleeding between her monthly periods which  generally last 7-8 days. Her main complaint was seeing a streak of  blue after voiding about a week after her period. Urologic workup has  been negative to date but a GYN ultrasound was ordered by the family  practice clinic which showed a thickened endometrium. A sonohysterogram  followed which showed 3 discreet uterine polyps.   PAST MEDICAL HISTORY:  She is overweight and has periodic headaches that  have not been evaluated to date (they sound like migraines).   PAST SURGICAL HISTORY:  None.   REVIEW OF SYSTEMS:  Last menstrual period June 05, 2007. She has  been abstinent since August 2008 and previously used condoms. Cervical  cultures were negative November 2008 and she works at a group home.   MEDICATIONS:  She takes ibuprofen p.r.n. No known drug allergies. No  latex allergies.   FAMILY HISTORY:  Negative for breast, GYN and colon malignancies.   SOCIAL HISTORY:  Negative for tobacco, alcohol or drug use.   PHYSICAL EXAMINATION:  VITAL SIGNS:  Stable, afebrile.  This pleasant young lady speaks English very well.  HEENT:  Normal.  HEART:  Regular rate and rhythm.  LUNGS:  Clear to auscultation bilaterally.  ABDOMEN:  Moderately obese, benign.  External genitalia normal. Uterus normal size and shape, anteverted,  mobile. __________  adnexa. Normal-appearing cervix.   ASSESSMENT/PLAN:  Breakthrough  bleeding with uterine polyps. Will plan  for a D&C and hysteroscopy. The risk of surgery include but not limited  to infection, bleeding explained, understood and accepted.      Allie Bossier, MD  Electronically Signed    MCD/MEDQ  D:  06/30/2007  T:  06/30/2007  Job:  [id number]

## 2010-08-22 NOTE — Group Therapy Note (Signed)
NAME:  Sandra Taylor, Sandra Taylor NO.:  [id number]   MEDICAL RECORD NO.:  [id number]          PATIENT TYPE:  WOC   LOCATION:  [facility]                   FACILITY:  [facility]   PHYSICIAN:  Odie Sera, D.O.    DATE OF BIRTH:  1985-09-26   DATE OF SERVICE:  02/06/2008                                  CLINIC NOTE   REASON FOR VISIT:  Followup MAU visit for right-sided and abdominal  pain.   HPI:  Sandra Taylor is a 25 year old gravida 0 female, who was seen here at  [facility] in March of 2009 for uterine polyp for which she had a D  and C, but is now here for new right-sided pain and lower abdominal  pain.  She was seen in the MAU on January 31, 2008, for this right-sided  pain and lower abdominal pain.  She was sent for transvaginal  ultrasound, which showed a new left ovarian cyst, possibly hemorrhagic  or functional, which recommended followup in four to six weeks, but  otherwise normal exam.  No cysts were seen on the right side and nothing  else was seen to correspond with her right-sided pain.  She was given a  shot of Toradol in the MAU, which did relieve her pain for the rest of  the day.  However, the next morning the pain returned.  It is worse when  she moves, walks, sleeps on that side, or does certain activities.  The  abdominal pain has resolved since her visit to the MAU.  She reports  regular menses.  Her last menstrual period was January 17, 2008 through  January 23, 2008.  She has not been sexually active since June.  On  further questioning, when asking her to point to where the pain is, she  points to her hip flexor and right hip joint area, rather than left  lower quadrant of abdomen.   PHYSICAL EXAM:  VITAL SIGNS:  Temperature is 97.5, pulse 83,  respirations 16, blood pressure 108/72, weight is 153.8 pounds and  height is 5 feet 5 inches tall.  IN GENERAL:  She is alert, well-developed, well-nourished female, in no  acute distress.  ABDOMINAL EXAM:   Revealed positive bowel sounds, was soft, nontender and  nondistended.  GU:  Bimanual exam was done.  The uterus was palpated and found to be  nontender and normal size.  Adnexa could not be appreciated.  MUSCULOSKELETAL:  Patient is tender to palpation over the right hip  joint and right upper hip flexor region.  There is no swelling or  redness seen on exam.   ASSESSMENT AND PLAN:  This is a 25 year old nulligravid patient,  complaining of right-sided pain.   1. For her pain, it does appear to be musculoskeletal in origin,      originating from her hip musculature.  Patient was shown stretches      to do one to two times a day, as well as instructed to ice the area      daily and continue ibuprofen 800 mg every 8 hours for at least two  weeks.  If this is not significantly helping her pain, she will      follow up with her primary care physician at the [facility].  2. For her left ovarian cyst, which was incidentally found on the      vaginal ultrasound done on January 31, 2008,      she will be scheduled for a repeat ultrasound in six weeks to      follow up resolution of this cyst and will also follow up here, at      the clinic, in eight weeks, so that she may receive the results of      this ultrasound.     ______________________________  Ardeen Garland, MD    ______________________________  Odie Sera, D.O.    LM/MEDQ  D:  02/06/2008  T:  02/06/2008  Job:  [id number]

## 2010-08-22 NOTE — Op Note (Signed)
NAME:  Sandra Taylor, Sandra Taylor                  ACCOUNT NO.:  [id number]   MEDICAL RECORD NO.:  [id number]          PATIENT TYPE:  AMB   LOCATION:  [facility]                           [facility]   PHYSICIAN:  Allie Bossier, MD        DATE OF BIRTH:  14-Feb-1986   DATE OF PROCEDURE:  DATE OF DISCHARGE:                               OPERATIVE REPORT   PREOPERATIVE DIAGNOSIS:  Uterine polyps and breakthrough bleeding.   POSTOPERATIVE DIAGNOSIS:  Uterine polyps and breakthrough bleeding.   PROCEDURE:  Hysteroscopy, resection of polyps and D&C.   SURGEON:  Allie Bossier, MD.   ANESTHESIA:  LMA and local, Raul Del, M.D.   COMPLICATIONS:  None.   ESTIMATED BLOOD LOSS:  75 mL.   SPECIMENS:  Uterine curettings and polyp.   COMPLICATIONS:  None.   PROCEDURE AND FINDINGS:  The risks, benefits and alternatives of surgery  were explained and accepted.  Consents were signed. She was taken to the  operating room, placed in the dorsal lithotomy position.  General  anesthesia was applied after she was comfortable.  Her vagina was  prepped and draped in the usual sterile fashion.  A bimanual exam  revealed normal size and shape, mobile, anteverted uterus and  nonenlarged adnexa.  A speculum was placed.  A single-tooth tenaculum  was used to grasp the anterior lip of the nulliparous cervix.  The soft  cervix was easily dilated to accommodate a resectoscope. Please note  that Hegar dilators were used. Prior to the dilation, I injected a total  of 10 mL of 1% plain lidocaine for a cervical block.  Hysteroscopy  revealed three polyps as described with the sonohysterogram. This was  photographed per the patient's request.  The resectoscope was used to  remove the three individual polyps, no bleeding was noted from their  sites. Curettage was then done in all quadrants and the fundus of the  uterus.  A gritty sensation was appreciated throughout. A large amount  of specimen was obtained.  She was taken to  the recovery room in stable  condition after being extubated. The instrument, sponge and needle  counts were correct.      Allie Bossier, MD  Electronically Signed     MCD/MEDQ  D:  06/30/2007  T:  06/30/2007  Job:  [id number]

## 2010-08-25 NOTE — Group Therapy Note (Signed)
NAME:  Sandra Taylor, Sandra Taylor NO.:  [id number]   MEDICAL RECORD NO.:  [id number]          PATIENT TYPE:  WOC   LOCATION:  [facility]                   FACILITY:  [facility]   PHYSICIAN:  Elsie Lincoln, MD      DATE OF BIRTH:  [birth year]   DATE OF SERVICE:                                    CLINIC NOTE   Patient is a 25 year old female, who is a follow-up from pelvic pain from  January 02, 2005 visit.  Patient had severe pain back in May, and was  found to have a small ovarian cyst on her left side that appeared  functional.  She came to our clinic and was not having any major pain at  that time.  She had been on birth control pills in the past to keep her from  ovulating.  However, it caused her chest pain, so she is no longer on them.  Since she has come to our clinic in September, she had pain starting January 08, 2005 and it ended January 14, 2005, which is somewhere around the  beginning of her menses.  She had pain again on February 05, 2005 and ended  February 10, 2005, and she had a period February 10, 2005, and then she had  pain again November 31, 2006 through March 12, 2005, and I do not know  when her period is at this point.  She is due for her period again, is  having a little bit of pain.  I do believe this is just postovulatory pain,  maybe a small amount of free fluid in her pelvis, or possibly endometriosis.  However, she has not had severe dysmenorrhea.  The patient was reassured.  We will repeat the ultrasound just to see that there is nothing else going  on.   PHYSICAL EXAMINATION:  VITAL SIGNS:  Today blood pressure 126/75, pulse 92,  weight 151.8, height 5 feet 6 inches.  ABDOMEN:  Soft, non-tender, non-distended.  No rebound, no guarding, no  hernia.  GENITALIA:  Tanner V. Vagina pink.  No lesions.  Cervix closed, non-tender.  Uterus mobile, non-tender.  Right adnexus questionable fullness.  However,  non-tender and no discrete mass.  Left adnexa  non-tender.  Note also,  patient has a virginal introitus and only one finger could be placed in the  vagina.   ASSESSMENT/PLAN:  [age]-year-old female with ovarian cyst and possibly  premenstrual syndrome.  1.  Repeat ultrasound.  2.  Continue pain diary.  3.  Return to clinic in six months.           ______________________________  Elsie Lincoln, MD     KL/MEDQ  D:  04/03/2005  T:  04/03/2005  Job:  [id number]

## 2010-08-25 NOTE — Group Therapy Note (Signed)
NAME:  Sandra Taylor, Sandra Taylor NO.:  [id number]   MEDICAL RECORD NO.:  [id number]          PATIENT TYPE:  WOC   LOCATION:  [facility]                   [facility]ACILITY[facility]   PHYSICIAN:  Kathlyn Sacramento, M.D.   DATE OF BIRTH:  1986-01-07   DATE OF SERVICE:                                    CLINIC NOTE   CHIEF COMPLAINT:  A cyst on her left ovary.   HISTORY OF PRESENT ILLNESS:  Patient says that for the past few months she  has had severe pain on the right side.  It is intermittent, lasts for 1-2  hours, seems to occur about 3 times a month.  Not associated with food, not  associated with her period.  Patient states that she has never been sexually  active in her life and she has regular menses, lasts 4 days, medium flow.  Does have some abdominal cramping, menstrual cramping 1 day before the first  day of her period but not excessive.  She states that the pelvic pain was so  severe that in May she [facility] and was told by an  ultrasound that she had a left ovarian cyst.  She also had GC/Chlamydia and  wet prep which were done at that time and did have BV which was treated by  her primary care physician who she saw a few days later.  Patient has been  sent in by her primary care physician because of this pelvic pain.  Patient  has been tried on a birth control pill but took it for 3 weeks and stated  that she had chest pain associated with it so has not restarted that.  Patient denies any discharge.   PAST MEDICAL HISTORY:  Negative.   PAST GYN HISTORY:  Negative.  Patient has never been sexually active.   PAST SURGICAL HISTORY:  None.   FAMILY HISTORY:  Diabetes in her grandparents.  She does not smoke, drink,  use drugs.   ALLERGIES:  None.   MEDICATIONS:  None.   Her first day of her last menstrual period was the beginning of September.  She is a Consulting civil engineer.   PHYSICAL EXAMINATION:  VITAL SIGNS:  Temp was 99.4, pulse 91, blood pressure  107/67, weight 155.  She has no allergies.  GENERAL:  Well-developed, well-nourished female in no acute distress.  CARDIOVASCULAR:  Normal S1, S2.  No murmurs, gallops, rubs.  CHEST:  Clear to auscultation bilaterally.  ABDOMEN:  Positive bowel sounds.  Soft.  Nontender.  Nondistended.  No  masses.  Patient does state that the pain that she has occurs on her right  side, mid abdomen, half way between her gallbladder and her pelvis.  Bimanual exam was done, patient had no adnexal tenderness.  No cervical  motion tenderness and no uterine enlargement.   ASSESSMENT/PLAN:  Patient's ultrasound report most likely is consistent with  a functional cyst.  Patient was instructed to keep a calendar type diary to  note when she has pain and if there is any associate factors with it and to  also note when  she has her period, so that a pattern can be established.  Patient was instructed to do this for approximately 3 months and then to  return to see [place] at that time.  A Pap smear was not indicated.  This patient  has never been sexually active and is 25 years of age.           ______________________________  Kathlyn Sacramento, M.D.     AC/MEDQ  D:  01/02/2005  T:  01/03/2005  Job:  [id number]

## 2011-01-01 LAB — URINALYSIS, ROUTINE W REFLEX MICROSCOPIC
Nitrite: NEGATIVE
Specific Gravity, Urine: 1.02
Urobilinogen, UA: 0.2

## 2011-01-01 LAB — URINE MICROSCOPIC-ADD ON

## 2011-01-01 LAB — CBC
MCHC: 35.4
MCV: 93.9
Platelets: 207
Platelets: 219
RDW: 12.6
RDW: 12.9

## 2011-01-01 LAB — HCG, SERUM, QUALITATIVE: Preg, Serum: NEGATIVE

## 2011-01-08 LAB — URINALYSIS, ROUTINE W REFLEX MICROSCOPIC
Nitrite: NEGATIVE
Specific Gravity, Urine: <1.005 — ABNORMAL LOW
Urobilinogen, UA: 0.2

## 2011-01-08 LAB — WET PREP, GENITAL: Clue Cells Wet Prep HPF POC: NONE SEEN

## 2011-01-08 LAB — URINE MICROSCOPIC-ADD ON: RBC / HPF: NONE SEEN

## 2011-01-08 LAB — URINE CULTURE: Colony Count: NO GROWTH

## 2011-01-08 LAB — CBC
HCT: 37.9
Hemoglobin: 13.2
MCHC: 34.8
MCV: 93.1
Platelets: 196
RBC: 4.07
RDW: 12.7
WBC: 4.9

## 2011-01-08 LAB — POCT PREGNANCY, URINE: Preg Test, Ur: NEGATIVE

## 2011-01-09 ENCOUNTER — Emergency Department (HOSPITAL_COMMUNITY)
Admission: EM | Admit: 2011-01-09 | Discharge: 2011-01-10 | Disposition: A | Discharge status: Home / Self Care | Payer: PRIVATE HEALTH INSURANCE | Attending: Emergency Medicine | Admitting: Emergency Medicine

## 2011-01-09 DIAGNOSIS — N739 Female pelvic inflammatory disease, unspecified: Secondary | ICD-10-CM | POA: Insufficient documentation

## 2011-01-09 DIAGNOSIS — N949 Unspecified condition associated with female genital organs and menstrual cycle: Secondary | ICD-10-CM | POA: Insufficient documentation

## 2011-01-10 LAB — URINALYSIS, ROUTINE W REFLEX MICROSCOPIC
Glucose, UA: NEGATIVE mg/dL
Hgb urine dipstick: NEGATIVE
Ketones, ur: 15 mg/dL — AB
Protein, ur: NEGATIVE mg/dL
pH: 5.5 (ref 5.0–8.0)

## 2011-01-10 LAB — BASIC METABOLIC PANEL
BUN: 9 mg/dL (ref 6–23)
Chloride: 99 mEq/L (ref 96–112)
Creatinine, Ser: <0.47 mg/dL — ABNORMAL LOW (ref 0.50–1.10)
Glucose, Bld: 93 mg/dL (ref 70–99)

## 2011-01-10 LAB — DIFFERENTIAL
Eosinophils Relative: 0 % (ref 0–5)
Lymphocytes Relative: 38 % (ref 12–46)
Lymphs Abs: 2.6 10*3/uL (ref 0.7–4.0)
Monocytes Absolute: 0.4 10*3/uL (ref 0.1–1.0)
Neutro Abs: 3.7 10*3/uL (ref 1.7–7.7)

## 2011-01-10 LAB — CBC
HCT: 39.8 % (ref 36.0–46.0)
Hemoglobin: 14.1 g/dL (ref 12.0–15.0)
MCV: 90.7 fL (ref 78.0–100.0)
RDW: 12.3 % (ref 11.5–15.5)
WBC: 6.8 10*3/uL (ref 4.0–10.5)

## 2011-01-10 LAB — WET PREP, GENITAL: Clue Cells Wet Prep HPF POC: NONE SEEN

## 2011-01-10 LAB — POCT PREGNANCY, URINE: Preg Test, Ur: NEGATIVE

## 2011-01-11 LAB — GC/CHLAMYDIA PROBE AMP, GENITAL
Chlamydia, DNA Probe: NEGATIVE
GC Probe Amp, Genital: NEGATIVE

## 2011-01-22 LAB — D-DIMER, QUANTITATIVE: D-Dimer, Quant: <0.22

## 2011-02-19 ENCOUNTER — Encounter: Payer: PRIVATE HEALTH INSURANCE | Admitting: Advanced Practice Midwife

## 2011-03-27 IMAGING — CT CT HEAD W/O CM
1 of 2 series · 13 of 30 positions shown, 17 images · non-contrast
Comparison: None.

CLINICAL DATA: Headache.Dizziness.

CT HEAD WITHOUT CONTRAST
TECHNIQUE: Contiguous axial images were obtained from the base of
the skull through the vertex without contrast.

[Series 2: brain · axial · 0.47mm/px · z∈[+155,+265]mm · 13 of 52 slices shown, 17 images]
[im 4/52  brain]
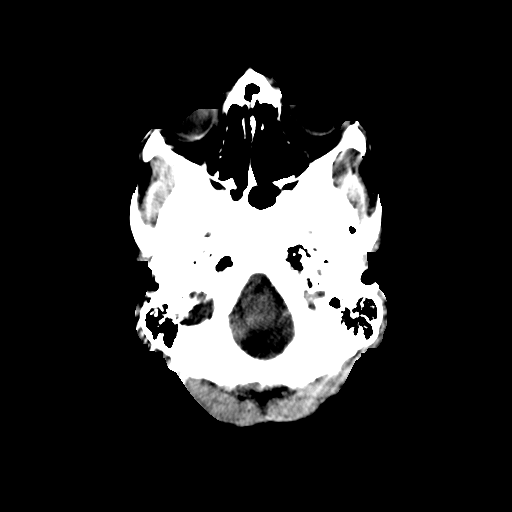
[im 4/52  bone]
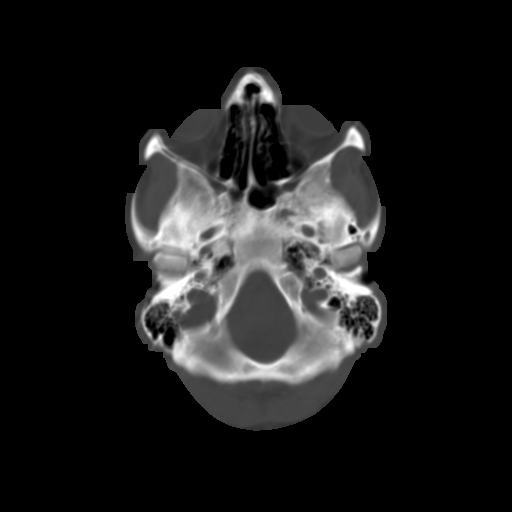
[im 8/52  brain]
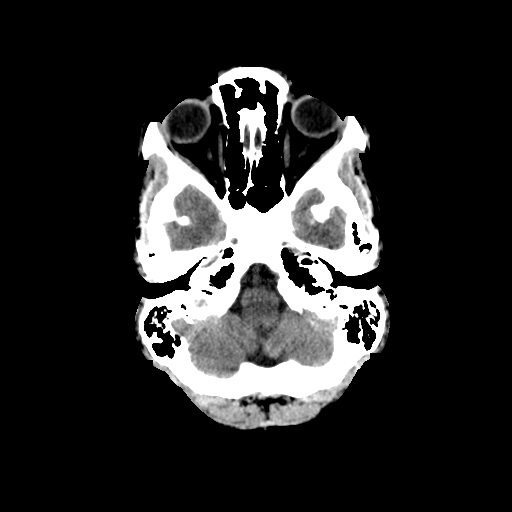
[im 11/52  brain]
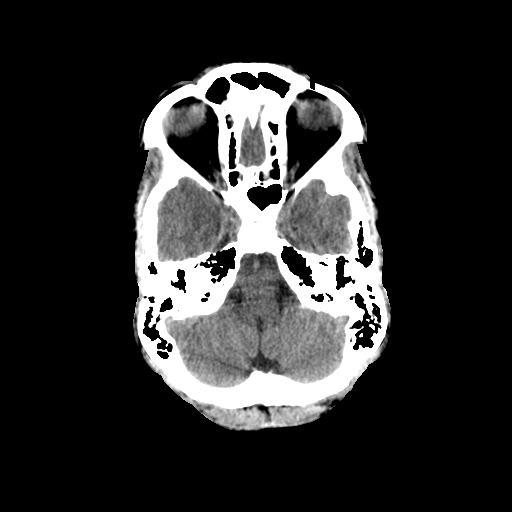
[im 15/52  brain]
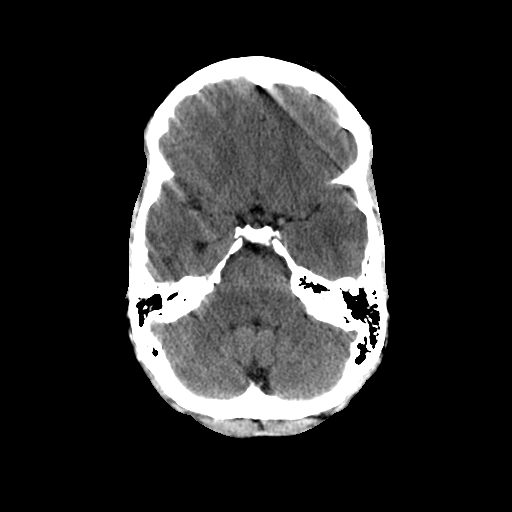
[im 19/52  brain]
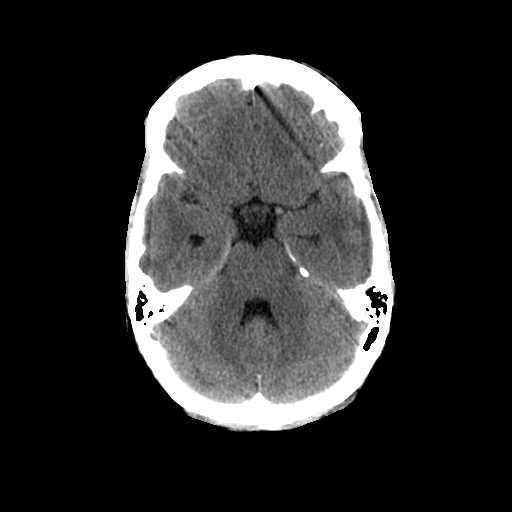
[im 19/52  bone]
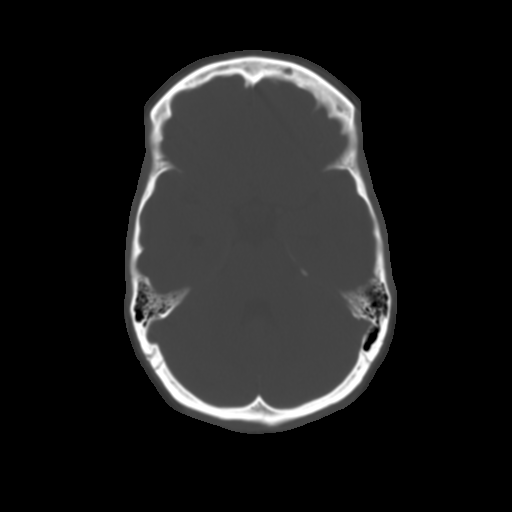
[im 22/52  brain]
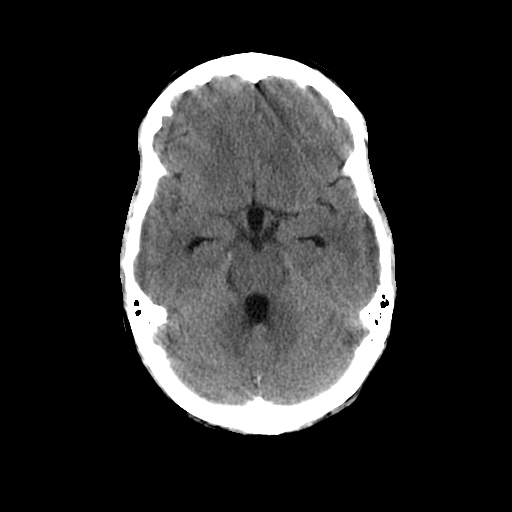
[im 26/52  brain]
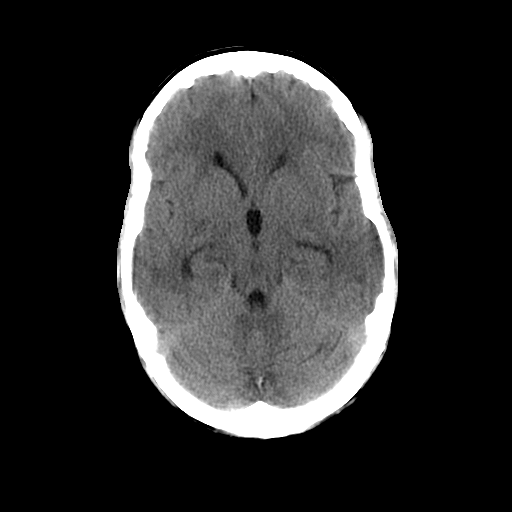
[im 30/52  brain]
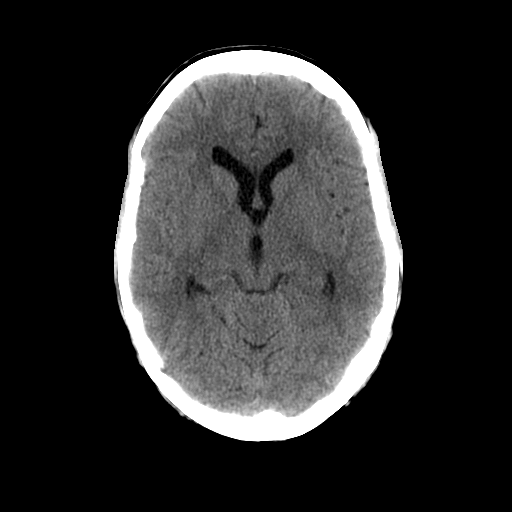
[im 33/52  brain]
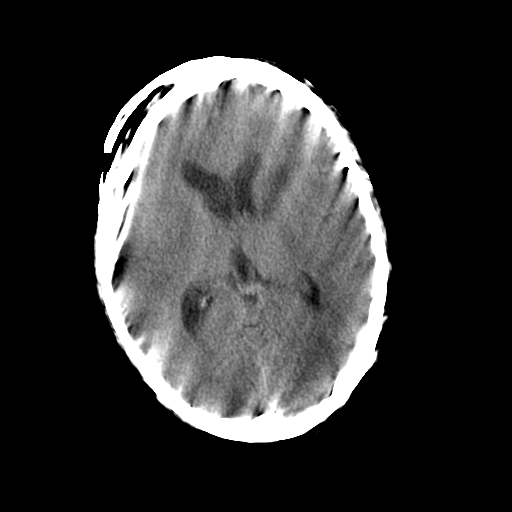
[im 33/52  bone]
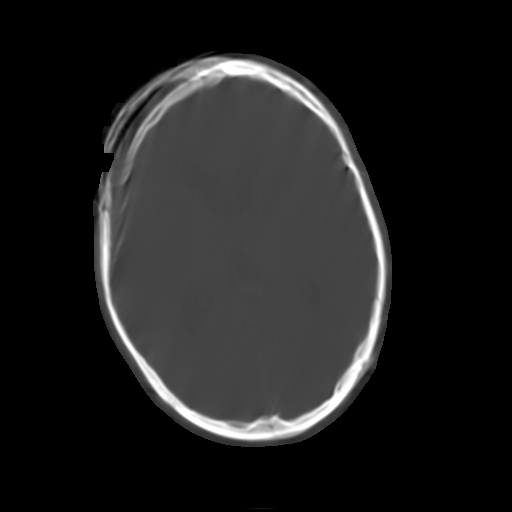
[im 37/52  brain]
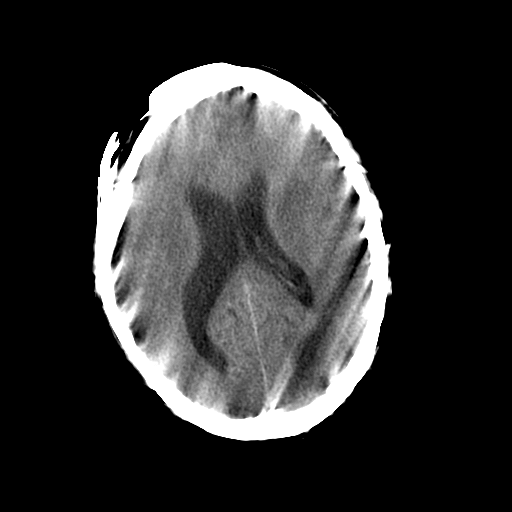
[im 41/52  brain]
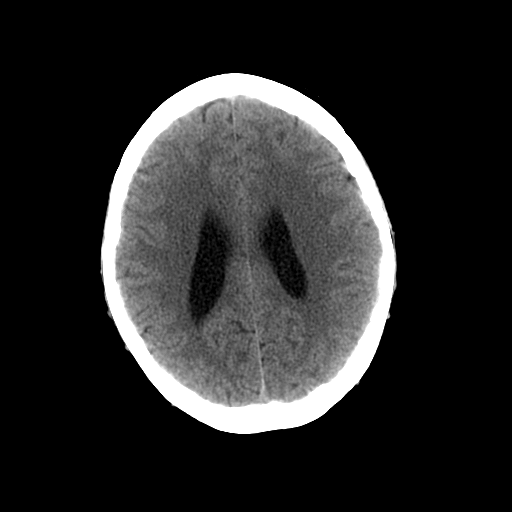
[im 44/52  brain]
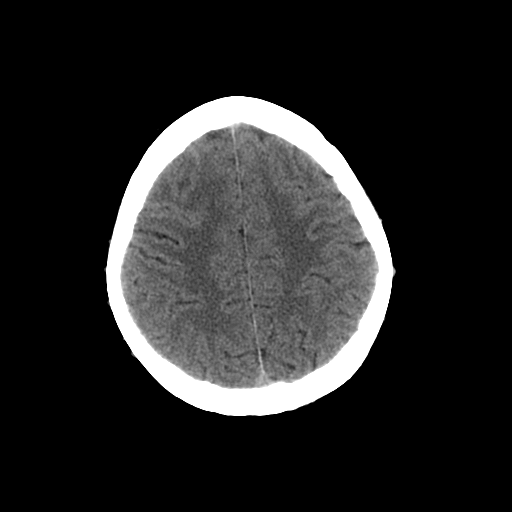
[im 48/52  brain]
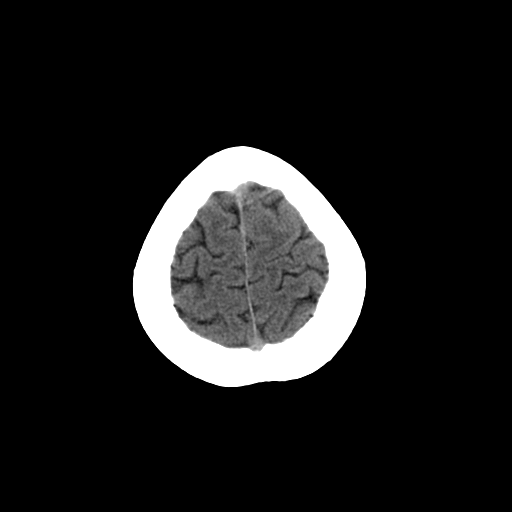
[im 48/52  bone]
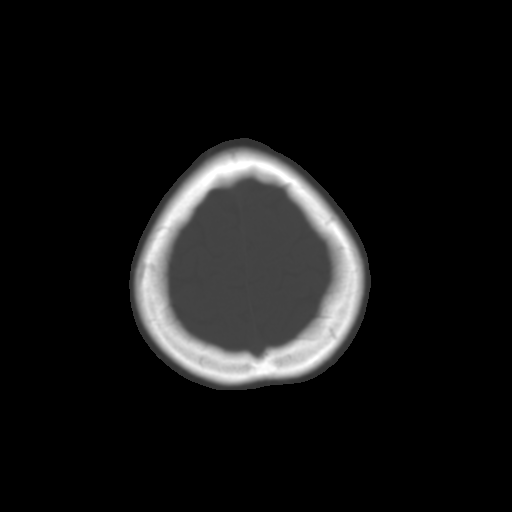

[13 of 30 positions shown; findings below may reference images not displayed]

FINDINGS: Significant motion artifact is present on the initial
scan due to patient motion.  Several slices were repeated on two
separate occasions, slices were repeated completing the study.No
mass lesion, mass effect, midline shift, hydrocephalus, hemorrhage.
No territorial ischemia or acute infarction. Paranasal sinuses
appear within normal limits.
IMPRESSION: No acute intracranial abnormality.

## 2011-06-18 ENCOUNTER — Other Ambulatory Visit: Payer: Self-pay | Admitting: Family Medicine

## 2011-06-18 ENCOUNTER — Encounter: Payer: Self-pay | Admitting: Family Medicine

## 2011-06-18 ENCOUNTER — Telehealth: Payer: Self-pay | Admitting: *Deleted

## 2011-06-18 ENCOUNTER — Ambulatory Visit (INDEPENDENT_AMBULATORY_CARE_PROVIDER_SITE_OTHER): Payer: PRIVATE HEALTH INSURANCE | Admitting: Family Medicine

## 2011-06-18 VITALS — BP 111/79 | HR 78 | Temp 98.0°F | Ht 66.0 in | Wt 156.4 lb

## 2011-06-18 DIAGNOSIS — R300 Dysuria: Secondary | ICD-10-CM

## 2011-06-18 DIAGNOSIS — L708 Other acne: Secondary | ICD-10-CM | POA: Insufficient documentation

## 2011-06-18 LAB — POCT URINALYSIS DIPSTICK
Bilirubin, UA: NEGATIVE
Blood, UA: NEGATIVE
Nitrite, UA: NEGATIVE
Protein, UA: NEGATIVE
Urobilinogen, UA: 1
pH, UA: 8

## 2011-06-18 MED ORDER — DOXYCYCLINE HYCLATE 100 MG PO TABS: 100.0000 mg | ORAL_TABLET | Freq: Two times a day (BID) | ORAL | Status: DC

## 2011-06-18 MED ORDER — MINOCYCLINE HCL 100 MG PO CAPS: 100.0000 mg | ORAL_CAPSULE | Freq: Two times a day (BID) | ORAL | Status: DC

## 2011-06-18 NOTE — Telephone Encounter (Signed)
[facility] pharmacy calling because patient was E-Rx'd Vibramycin and minocycline.  Wants to verify if patient needs both abxs.  Chart states minocycline only, but will check with Dr. Rivka Safer and call pharmacy back.   Gaylene Brooks, RN

## 2011-06-18 NOTE — Telephone Encounter (Signed)
pcp has taken care of this.Sandra Taylor

## 2011-06-18 NOTE — Assessment & Plan Note (Signed)
Unknown cause, but has hx of acne Continue with salicylic acid/benzoyl peroxide facial cleansers which are helping. 7 day course of minocycline. Advised to stay out of sun.

## 2011-06-18 NOTE — Patient Instructions (Signed)
It was great to see you today!  You have folliculitis and this will clear up on its own.   The antibiotics will help this clear up faster. Do not go in the sun while you take this.  Continue to use your facial cleansers.

## 2011-06-18 NOTE — Telephone Encounter (Signed)
Forwarded to Dr. Rivka Safer for clarification.Sandra Taylor [place]

## 2011-06-18 NOTE — Telephone Encounter (Signed)
Discussed normal U/A results.

## 2011-06-18 NOTE — Progress Notes (Signed)
  Subjective:    Patient ID: Sandra Taylor, female    DOB: 1985/07/18, 26 y.o.   MRN: [id number]  HPI 1. Facial rash 2 week history of folliculitis on face that started with redness. No allergies. No contact with sick persons or animals. No travel. No new foods/detergents/soaps. No contact with hot tubs or pools. No sharing of towels. Patient has a history of acne in the past, but not as bad as this. No fever.  Review of Systems Pertinent items are noted in HPI. No fever, chills, night sweats, weight loss.     Objective:   Physical Exam Filed Vitals:   06/18/11 0904  BP: 111/79  Pulse: 78  Temp: 98 F (36.7 C)  TempSrc: Oral  Height: 5\' 6"  (1.676 m)  Weight: 156 lb 6.4 oz (70.943 kg)  Face: folliculitis on all aspects of face except ears/mucous membranes, eyelids.  General: well appearing somali female.    Assessment & Plan:

## 2011-07-11 ENCOUNTER — Telehealth: Payer: Self-pay | Admitting: Family Medicine

## 2011-07-11 MED ORDER — MINOCYCLINE HCL 100 MG PO CAPS: 100.0000 mg | ORAL_CAPSULE | Freq: Two times a day (BID) | ORAL | Status: DC

## 2011-07-11 NOTE — Telephone Encounter (Signed)
Pt is requesting to continue minocycline (MINOCIN,DYNACIN) 100 MG  Acne is clearing up, but it's slower to do so and wants to stay on the abx a little longer. 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

## 2011-07-11 NOTE — Telephone Encounter (Signed)
Called and informed pt of Rx sent to her pharmacy and directions that were given by Dr. Louanne Belton. Pt was told that she will need to make an appt so he can follow her progress she stated that she will check her schedule and call to make the appt with him.Laureen Ochs, Viann Shove

## 2011-07-11 NOTE — Telephone Encounter (Signed)
Sent in for 1 month.  Instruct her to schedule an appointment with me at that time to check on her progress.  Please also tell her to avoid alcohol while taking this med and to stay out of the sun and use plenty of sunscreen while taking it.

## 2011-07-11 NOTE — Telephone Encounter (Signed)
Will forward to Dr Ritch 

## 2011-07-14 ENCOUNTER — Encounter (HOSPITAL_COMMUNITY): Payer: Self-pay | Admitting: Emergency Medicine

## 2011-07-14 ENCOUNTER — Emergency Department (HOSPITAL_COMMUNITY)
Admission: EM | Admit: 2011-07-14 | Discharge: 2011-07-14 | Disposition: A | Discharge status: Home / Self Care | Payer: PRIVATE HEALTH INSURANCE | Source: Home / Self Care | Attending: Emergency Medicine | Admitting: Emergency Medicine

## 2011-07-14 DIAGNOSIS — R102 Pelvic and perineal pain: Secondary | ICD-10-CM

## 2011-07-14 DIAGNOSIS — N949 Unspecified condition associated with female genital organs and menstrual cycle: Secondary | ICD-10-CM

## 2011-07-14 LAB — POCT URINALYSIS DIP (DEVICE)
Bilirubin Urine: NEGATIVE
Ketones, ur: NEGATIVE mg/dL
Leukocytes, UA: NEGATIVE
Protein, ur: NEGATIVE mg/dL
Specific Gravity, Urine: 1.015 (ref 1.005–1.030)
pH: 7.5 (ref 5.0–8.0)

## 2011-07-14 LAB — WET PREP, GENITAL
Clue Cells Wet Prep HPF POC: NONE SEEN
Trich, Wet Prep: NONE SEEN

## 2011-07-14 MED ORDER — NAPROXEN 500 MG PO TABS: 500.0000 mg | ORAL_TABLET | Freq: Two times a day (BID) | ORAL | Status: AC

## 2011-07-14 NOTE — ED Notes (Signed)
Asked patient to place on gown, equipment at bedside

## 2011-07-14 NOTE — ED Provider Notes (Signed)
History     CSN: [id number]  Arrival date & time 07/14/11  1558   First MD Initiated Contact with Patient 07/14/11 1600      Chief Complaint  Patient presents with  . Abdominal Pain    (Consider location/radiation/quality/duration/timing/severity/associated sxs/prior treatment) HPI Comments: Patient presents urgent care complaining of two-day history of pelvic pain points to suprapubic region) also have noticed minimal vaginal discharge and some discomfort and pressure when she urinates. No burning. No vomiting, no nausea no fevers. Patient incidentally just finished her menstrual period 2 days ago  Patient is a 26 y.o. female presenting with abdominal pain. The history is provided by the patient and a parent.  Abdominal Pain The primary symptoms of the illness include abdominal pain, dysuria and vaginal discharge. The primary symptoms of the illness do not include fever, fatigue, nausea, vomiting or vaginal bleeding. The current episode started 2 days ago. The onset of the illness was gradual. The problem has not changed since onset. The dysuria is not associated with hematuria, frequency or urgency.  The vaginal discharge is associated with dysuria.  The patient states that she believes she is currently not pregnant. The patient has not had a change in bowel habit. Symptoms associated with the illness do not include chills, anorexia, heartburn, constipation, urgency, hematuria, frequency or back pain.    History reviewed. No pertinent past medical history.  History reviewed. No pertinent past surgical history.  History reviewed. No pertinent family history.  History  Substance Use Topics  . Smoking status: Never Smoker   . Smokeless tobacco: Not on file  . Alcohol Use: No    OB History    Grav Para Term Preterm Abortions TAB SAB Ect Mult Living                  Review of Systems  Constitutional: Negative for fever, chills, appetite change and fatigue.  Gastrointestinal:  Positive for abdominal pain. Negative for heartburn, nausea, vomiting, constipation and anorexia.  Genitourinary: Positive for dysuria and vaginal discharge. Negative for urgency, frequency, hematuria and vaginal bleeding.  Musculoskeletal: Negative for back pain.    Allergies  Review of patient's allergies indicates no known allergies.  Home Medications   Current Outpatient Rx  Name Route Sig Dispense Refill  . IBUPROFEN 800 MG PO TABS Oral Take 800 mg by mouth 3 (three) times daily with meals. As needed for cramps     . MINOCYCLINE HCL 100 MG PO CAPS Oral Take 1 capsule (100 mg total) by mouth 2 (two) times daily. For 30 days 60 capsule 0  . CEPHALEXIN 500 MG PO CAPS Oral Take 500 mg by mouth 2 (two) times daily. For 7 days     . CLINDAMYCIN PHOSPHATE 1 % EX LOTN Topical Apply topically 2 (two) times daily. Apply to affected ares of face for 6 weeks. Dispense 60ml     . FLUCONAZOLE 150 MG PO TABS Oral Take 150 mg by mouth once.      Marland Kitchen NAPROXEN 500 MG PO TABS Oral Take 1 tablet (500 mg total) by mouth 2 (two) times daily. 30 tablet 0  . SUMATRIPTAN SUCCINATE 50 MG PO TABS Oral Take 50 mg by mouth as directed. One tablet by mouth at onset of headache. May repeat every 2 hours up to a max of 4 tablets in 24 hours     . TRIAMCINOLONE ACETONIDE 0.1 % EX OINT Topical Apply topically 3 (three) times daily. As needed for itching  BP 117/74  Pulse 86  Temp(Src) 98.3 F (36.8 C) (Oral)  Resp 16  SpO2 99%  LMP 07/12/2011  Physical Exam  Nursing note and vitals reviewed. Constitutional: Vital signs are normal. She appears well-developed and well-nourished.  HENT:  Head: Normocephalic.  Abdominal: She exhibits no distension and no mass. There is tenderness in the suprapubic area. There is no rigidity, no rebound, no guarding, no tenderness at McBurney's point and negative Murphy's sign.  Genitourinary: There is no rash or tenderness on the right labia. There is no rash or tenderness on  the left labia. No erythema, tenderness or bleeding around the vagina. No foreign body around the vagina. Vaginal discharge found.  Skin: No rash noted.    ED Course  Procedures (including critical care time)   Labs Reviewed  POCT URINALYSIS DIP (DEVICE)  POCT PREGNANCY, URINE  URINE CULTURE  GC/CHLAMYDIA PROBE AMP, GENITAL  WET PREP, GENITAL   No results found.   1. Pelvic pain       MDM  Patient presents urgent care with pelvic pain and dysuria for about 48 hours. Minimal non-characteristic mucousy vaginal discharge noted. Samples obtained for STD screening and wet prep, patient recently had her menstrual period.        Jimmie Molly, MD 07/14/11 [phone]

## 2011-07-14 NOTE — ED Notes (Signed)
Abdominal pain, frequent urination for 2 days.

## 2011-07-14 NOTE — ED Notes (Signed)
maranda assisted dr Ladon Applebaum with pelvic

## 2011-07-14 NOTE — Discharge Instructions (Signed)
As discussed we will contact you only if abnormal test results. Take naproxen for pain and discomfort. We discussed your urine normal test results. If any further changes or worsening pain especially if your pain localizes to 1 side only should go to [facility] for further evaluation.   Abdominal Pain, Women Abdominal (stomach, pelvic, or belly) pain can be caused by many things. It is important to tell your doctor:  The location of the pain.   Does it come and go or is it present all the time?   Are there things that start the pain (eating certain foods, exercise)?   Are there other symptoms associated with the pain (fever, nausea, vomiting, diarrhea)?  All of this is helpful to know when trying to find the cause of the pain. CAUSES   Stomach: virus or bacteria infection, or ulcer.   Intestine: appendicitis (inflamed appendix), regional ileitis (Crohn's disease), ulcerative colitis (inflamed colon), irritable bowel syndrome, diverticulitis (inflamed diverticulum of the colon), or cancer of the stomach or intestine.   Gallbladder disease or stones in the gallbladder.   Kidney disease, kidney stones, or infection.   Pancreas infection or cancer.   Fibromyalgia (pain disorder).   Diseases of the female organs:   Uterus: fibroid (non-cancerous) tumors or infection.   Fallopian tubes: infection or tubal pregnancy.   Ovary: cysts or tumors.   Pelvic adhesions (scar tissue).   Endometriosis (uterus lining tissue growing in the pelvis and on the pelvic organs).   Pelvic congestion syndrome (female organs filling up with blood just before the menstrual period).   Pain with the menstrual period.   Pain with ovulation (producing an egg).   Pain with an IUD (intrauterine device, birth control) in the uterus.   Cancer of the female organs.   Functional pain (pain not caused by a disease, may improve without treatment).   Psychological pain.   Depression.  DIAGNOSIS    Your doctor will decide the seriousness of your pain by doing an examination.  Blood tests.   X-rays.   Ultrasound.   CT scan (computed tomography, special type of X-ray).   MRI (magnetic resonance imaging).   Cultures, for infection.   Barium enema (dye inserted in the large intestine, to better view it with X-rays).   Colonoscopy (looking in intestine with a lighted tube).   Laparoscopy (minor surgery, looking in abdomen with a lighted tube).   Major abdominal exploratory surgery (looking in abdomen with a large incision).  TREATMENT  The treatment will depend on the cause of the pain.   Many cases can be observed and treated at home.   Over-the-counter medicines recommended by your caregiver.   Prescription medicine.   Antibiotics, for infection.   Birth control pills, for painful periods or for ovulation pain.   Hormone treatment, for endometriosis.   Nerve blocking injections.   Physical therapy.   Antidepressants.   Counseling with a psychologist or psychiatrist.   Minor or major surgery.  HOME CARE INSTRUCTIONS   Do not take laxatives, unless directed by your caregiver.   Take over-the-counter pain medicine only if ordered by your caregiver. Do not take aspirin because it can cause an upset stomach or bleeding.   Try a clear liquid diet (broth or water) as ordered by your caregiver. Slowly move to a bland diet, as tolerated, if the pain is related to the stomach or intestine.   Have a thermometer and take your temperature several times a day, and record it.  Bed rest and sleep, if it helps the pain.   Avoid sexual intercourse, if it causes pain.   Avoid stressful situations.   Keep your follow-up appointments and tests, as your caregiver orders.   If the pain does not go away with medicine or surgery, you may try:   Acupuncture.   Relaxation exercises (yoga, meditation).   Group therapy.   Counseling.  SEEK MEDICAL CARE IF:   You  notice certain foods cause stomach pain.   Your home care treatment is not helping your pain.   You need stronger pain medicine.   You want your IUD removed.   You feel faint or lightheaded.   You develop nausea and vomiting.   You develop a rash.   You are having side effects or an allergy to your medicine.  SEEK IMMEDIATE MEDICAL CARE IF:   Your pain does not go away or gets worse.   You have a fever.   Your pain is felt only in portions of the abdomen. The right side could possibly be appendicitis. The left lower portion of the abdomen could be colitis or diverticulitis.   You are passing blood in your stools (bright red or black tarry stools, with or without vomiting).   You have blood in your urine.   You develop chills, with or without a fever.   You pass out.  MAKE SURE YOU:   Understand these instructions.   Will watch your condition.   Will get help right away if you are not doing well or get worse.  Document Released: 01/21/2007 Document Revised: 03/15/2011 Document Reviewed: 02/10/2009 [facility] Patient Information 2012 [place], [place].

## 2011-07-14 NOTE — ED Notes (Signed)
Reports minimal discharge last night and reported as "normal"  Patient complains of low abdominal pain and urinating alot

## 2011-07-16 LAB — GC/CHLAMYDIA PROBE AMP, GENITAL
Chlamydia, DNA Probe: NEGATIVE
GC Probe Amp, Genital: NEGATIVE

## 2011-07-16 LAB — URINE CULTURE
Colony Count: NO GROWTH
Culture  Setup Time: 201304070154

## 2011-08-16 ENCOUNTER — Ambulatory Visit: Payer: PRIVATE HEALTH INSURANCE | Admitting: Family Medicine

## 2011-08-17 ENCOUNTER — Encounter: Payer: Self-pay | Admitting: Family Medicine

## 2011-08-17 ENCOUNTER — Ambulatory Visit (INDEPENDENT_AMBULATORY_CARE_PROVIDER_SITE_OTHER): Payer: PRIVATE HEALTH INSURANCE | Admitting: Family Medicine

## 2011-08-17 VITALS — BP 116/82 | HR 81 | Temp 98.2°F | Ht 66.0 in | Wt 160.1 lb

## 2011-08-17 DIAGNOSIS — L708 Other acne: Secondary | ICD-10-CM

## 2011-08-17 DIAGNOSIS — L709 Acne, unspecified: Secondary | ICD-10-CM | POA: Insufficient documentation

## 2011-08-17 NOTE — Patient Instructions (Signed)
It was great to see you today!  Schedule an appointment to see me as needed.  Still having hyperkeratotic pores on forehead that may be acne, or just oil clogged pores.  Recommended every other day exfoliation with beaded cleanser, recommended twice a week facial mask for exfoliation and declogging of pores, BID facial cleanser, and BID oil removing cream product.  I have referred you to dermatology, expect a call in the next week.

## 2011-08-17 NOTE — Assessment & Plan Note (Signed)
Still having hyperkeratotic pores on forehead that may be acne, or just oil clogged pores. Recommended every other day exfoliation with beaded cleanser, recommended twice a week facial mask for exfoliation and declogging of pores, BID facial cleanser, and BID oil removing cream product. Will refer to Dermatology per patient request.

## 2011-08-17 NOTE — Progress Notes (Signed)
  Subjective:   Patient ID: Sandra Taylor, female DOB: 29-Oct-1985 26 y.o. MRN: [id number] HPI:  1. Acne Patient was seen by me in the past and was treated with antibiotics for acne on the forehead, temples and cheeks. Today the lesions look less like acne and more like clogging of her pores.  Onset: has been chronic  Time period of:  year(s).  Severity is described as mild-moderate.  Course of her symptoms over time is recurrent. Aggravating: oilly skin Alleviating: antibiotics prescribed helped briefly, but then came back.  Associated sx/sn: oily skin with visible clogged pores on forehead. No fevers or other rash.   Tobacco use: Patient is a non smoker.   Review of Systems: Pertinent items are noted in HPI.  Labs Reviewed: none    Objective:   Filed Vitals:   08/17/11 1011  BP: 116/82  Pulse: 81  Temp: 98.2 F (36.8 C)  TempSrc: Oral  Height: 5\' 6"  (1.676 m)  Weight: 160 lb 1.6 oz (72.621 kg)   Physical Exam: General: pleasant, aaf female. Forehead: multiple hyperkeratotic lesions on forehead. Few pustules.   Assessment & Plan:

## 2011-09-27 ENCOUNTER — Telehealth: Payer: Self-pay | Admitting: Family Medicine

## 2011-09-27 NOTE — Telephone Encounter (Signed)
Pt went to dermatologist that Dr Louanne Belton referred her to and they want to put her on a medicine that requires that she be on the pill so that she doesn't become pregnant.  Needs to talk to nurse about what to do.

## 2011-09-27 NOTE — Telephone Encounter (Signed)
She will need to be on a more reliable form of birth control than a pill to be one one of the meds a dermatologist prescribes.  Options are Depo, nexplanon, IUD, or Nuva-Ring.  If she wants to discuss any of these further, she is welcome to make an appointment.

## 2011-09-27 NOTE — Telephone Encounter (Signed)
Patient state that Dermatologist will not prescribe her a medication (she does not know what the name is) that she can not be pregnant while on it. The Dermatologist told her that being she does not prescribed birth control that her PCP will be the one that has to. She would like to be on a pill that does keep her cycle regular. Should she come in for a visit before pills can be prescribed?

## 2011-10-02 NOTE — Telephone Encounter (Signed)
Called patient, mailbox full so unable to leave message, will try again later.

## 2011-10-02 NOTE — Telephone Encounter (Signed)
Spoke with patient, she states she is not ready to start the med her dermatologist suggested due to all the side effects. She says her dermatologists then recommended her PCP starting her on a birth control pill that will also help with her acne, she says derm already gave her abx to start. Will forward to PCP, patient uses [facility] on [address].

## 2011-10-03 NOTE — Telephone Encounter (Signed)
I'm afraid that a new start on OCPs really does require an office visit and a urine pregnancy test.

## 2011-10-03 NOTE — Telephone Encounter (Signed)
Spoke with patient and she states she just had upreg done at dermatologists office and she would come in and sign a ROI because she doesn't want to be charged for another one. Informed patient that it was procedure for patient to have office visit before starting OCP's. Patient still reluctant to schedule appointment.

## 2011-10-04 ENCOUNTER — Telehealth: Payer: Self-pay | Admitting: Family Medicine

## 2011-10-04 NOTE — Telephone Encounter (Signed)
Called office to obtain verification of pregnancy test. Note faxed over and results are NEG.Sandra Taylor, Sandra Taylor

## 2011-10-04 NOTE — Telephone Encounter (Signed)
Patient need refill on birth control and have completed ROI to obtain last upreg result from [facility].  Please call patient to let he know when rx can be picked up.

## 2011-10-05 MED ORDER — DROSPIRENONE-ETHINYL ESTRADIOL 3-0.02 MG PO TABS: 1.0000 | ORAL_TABLET | Freq: Every day | ORAL | Status: DC

## 2011-10-05 NOTE — Telephone Encounter (Signed)
Has been sent in.  This is not adequate protection for long term use of teratogenic medications for acne (i.e. Oral retinoids or doxycycline/minocycline)

## 2011-10-05 NOTE — Telephone Encounter (Signed)
Called pt and lvm informing that rx has been sent.Loralee Pacas Bolt

## 2011-12-18 ENCOUNTER — Telehealth (HOSPITAL_COMMUNITY): Payer: Self-pay | Admitting: *Deleted

## 2011-12-18 NOTE — ED Notes (Signed)
Pt. came to [facility] and asked if we had ever given her Toradol. I checked her record form 07/14/11 and 03/29/09 and told her no we never gave her that medication. Vassie Moselle 12/18/2011

## 2012-07-09 ENCOUNTER — Ambulatory Visit (INDEPENDENT_AMBULATORY_CARE_PROVIDER_SITE_OTHER): Payer: PRIVATE HEALTH INSURANCE | Admitting: Family Medicine

## 2012-07-09 ENCOUNTER — Encounter: Payer: Self-pay | Admitting: Family Medicine

## 2012-07-09 VITALS — BP 105/78 | HR 73 | Temp 98.2°F | Ht 66.0 in | Wt 151.0 lb

## 2012-07-09 DIAGNOSIS — J029 Acute pharyngitis, unspecified: Secondary | ICD-10-CM

## 2012-07-09 NOTE — Progress Notes (Signed)
Patient ID: Sandra Taylor, female   DOB: Apr 25, 1985, 27 y.o.   MRN: [id number] Subjective: The patient is a 27 y.o. year old female who presents today for sore throat.  Present x1 month.  Significant mucous production with cough.  No fevers/chills.  Taking PRN benadryl x1 week.  Has h/o tonsil stone that patient reports got better with treatment of acne with doxy.  Patient's past medical, social, and family history were reviewed and updated as appropriate. History  Substance Use Topics  . Smoking status: Never Smoker   . Smokeless tobacco: Not on file  . Alcohol Use: No   Objective:  Filed Vitals:   07/09/12 0848  BP: 105/78  Pulse: 73  Temp: 98.2 F (36.8 C)   Gen: NAD HEENT: MMM, slight posterior cervical adenopathy, tonsils are grade II but no exudate, slight pharyngeal cobblestoning but no exudate.  No pharyngeal redness.  Clear rhinorrhea.  Assessment/Plan: Sore throat, likely related to allergies, also possibly reflux .  Will treat with scheduled benadryl and claritin.  With normal rapid strep, don't see any need for abx.  Please also see individual problems in problem list for problem-specific plans.

## 2012-07-09 NOTE — Patient Instructions (Signed)
It was great to see you today! I would suggest that you take benadryl at night and Claritin in the morning every day for the next 2-3 weeks.  If your symptoms aren't getting better, let me know and we will add an acid reflux medicine as well.  You do not have an infection in your tonsils.

## 2012-12-19 ENCOUNTER — Telehealth: Payer: Self-pay | Admitting: Family Medicine

## 2012-12-19 NOTE — Telephone Encounter (Signed)
Pt called and wanted to talk to a nurse. Her period was 5 days late and she took 3 pregnancy test 2 where positive and 1 was negative. Now she did get her period and she doesn't know if she is pregnant or not and wants to know what to do. JW

## 2012-12-22 NOTE — Telephone Encounter (Signed)
Patient calls again. Please call patient on her cell [phone]

## 2012-12-22 NOTE — Telephone Encounter (Signed)
Not currently on birth control.  Has had unprotected intercourse.  LMP was 12/19/12 and was 5 days late.  Has been having abdominal pain and cramping.  Had discharge when period was late.  Pregnancy test on Wed (9/10) was positive x 2 and was negative on Thurs.  Period finally came on Friday.  Patient not sure if she is pregnant and is requesting an appt due to the 2 positive pregnancy tests.  Patient can only come late afternoon and requesting female MD.  Appt scheduled with Dr. Reola Calkins for 12/24/12 at 4:00 pm.  Patient informed if pain worsens and unable to wait, she can call back to check for canceled appts or can go to urgent care/ED.  Patient agreeable and verbalized understanding.  Gaylene Brooks, RN

## 2012-12-24 ENCOUNTER — Other Ambulatory Visit (HOSPITAL_COMMUNITY)
Admission: RE | Admit: 2012-12-24 | Discharge: 2012-12-24 | Disposition: A | Discharge status: Home / Self Care | Payer: PRIVATE HEALTH INSURANCE | Source: Ambulatory Visit | Attending: Family Medicine | Admitting: Family Medicine

## 2012-12-24 ENCOUNTER — Encounter: Payer: Self-pay | Admitting: Family Medicine

## 2012-12-24 ENCOUNTER — Ambulatory Visit (INDEPENDENT_AMBULATORY_CARE_PROVIDER_SITE_OTHER): Payer: PRIVATE HEALTH INSURANCE | Admitting: Family Medicine

## 2012-12-24 VITALS — BP 119/77 | HR 91 | Temp 98.4°F | Wt 150.0 lb

## 2012-12-24 DIAGNOSIS — N912 Amenorrhea, unspecified: Secondary | ICD-10-CM

## 2012-12-24 DIAGNOSIS — Z113 Encounter for screening for infections with a predominantly sexual mode of transmission: Secondary | ICD-10-CM | POA: Insufficient documentation

## 2012-12-24 DIAGNOSIS — Z124 Encounter for screening for malignant neoplasm of cervix: Secondary | ICD-10-CM

## 2012-12-24 DIAGNOSIS — N888 Other specified noninflammatory disorders of cervix uteri: Secondary | ICD-10-CM

## 2012-12-24 DIAGNOSIS — N760 Acute vaginitis: Secondary | ICD-10-CM

## 2012-12-24 DIAGNOSIS — Z01419 Encounter for gynecological examination (general) (routine) without abnormal findings: Secondary | ICD-10-CM | POA: Insufficient documentation

## 2012-12-24 DIAGNOSIS — R102 Pelvic and perineal pain: Secondary | ICD-10-CM

## 2012-12-24 DIAGNOSIS — R109 Unspecified abdominal pain: Secondary | ICD-10-CM

## 2012-12-24 NOTE — Patient Instructions (Signed)
It was a pleasure meeting you I will call you with the results of your labs We will send you for the ultrasound as well.

## 2012-12-24 NOTE — Progress Notes (Signed)
Subjective:     Patient ID: Sandra Taylor, female   DOB: December 25, 1985, 27 y.o.   MRN: [id number]  HPI  27 yo Female Pt here for  abdominal pain  - abdominal pain started around 9/3-9/4 - felt like lower abdominal cramping that would come and go - it was very bad the first week but in the last few weeks it seems to be getting slightly better overall - low pelvic pain 10/10 at worst and 2/10 at best.   Had a regular period on 11/14/12 Was late in September. Took pregnancy test 9/2 and was positive. Then took another one around 9/8 and this was negative.   - period came on 9/12 and lasted about 3 days but then since then has continued spotting but not having to change a panty liner at all.   Is having some nausea also and slight decreased appetite.   Sexually active last time around mid august - not since   No fevers, chills, weight loss, hx of abd surgery. Vomiting, diarrhea, constipation, dysuria, hematuria. No HA.     Review of Systems See above     Objective:   Physical Exam BP 119/77  Pulse 91  Temp(Src) 98.4 F (36.9 C) (Oral)  Wt 150 lb (68.04 kg)  BMI 24.22 kg/m2  LMP 11/14/2012 GENERAL: Well-developed, well-nourished female in no acute distress.  LUNGS: normal effort ABDOMEN: Soft, mildly tender suprapubically, nondistended. No organomegaly. PELVIC: Normal external female genitalia. Vagina is pink and rugated.  Normal discharge. Normal cervix contour. Cervical ectropion and ?increased friability of cervix. Pap smear obtained. Uterus is normal in size. Slight tenderness with palpation of the uterus. No adnexal mass or tenderness.  EXTREMITIES: No cyanosis, clubbing, or edema, 2+ distal pulses.     Assessment:     Amenorrhea - Plan: POCT urine pregnancy  Screening for malignant neoplasm of the cervix - Plan: Cytology - PAP  Vaginitis and vulvovaginitis, unspecified - Plan: Cervicovaginal ancillary only  Pelvic pain - Plan: US Transvaginal Non-OB       Plan:      Pelvic pain - unclear etiology.  Could be an incomplete abortion. Cervix still slightly open on exam.  Doubt ovarian torsion given chronicity. Doubt ectopic in the setting of neg pregnancy test today and negative  In the past.   - PAP done today given friability of cervix although suspect more of an ectropion.  - GC/chl sent as well  - will send for transvaginal US given the persistence of her pain.  - f/u if no improvement or worsening.

## 2012-12-25 ENCOUNTER — Telehealth: Payer: Self-pay | Admitting: *Deleted

## 2012-12-25 NOTE — Telephone Encounter (Signed)
Transvaginal u/s scheduled at [facility] for 12/29/12 at 3:00pm.  Pt notified at [phone].   Samara Stankowski, Darlyne Russian, CMA

## 2012-12-25 NOTE — Telephone Encounter (Signed)
Thank you :)

## 2012-12-29 ENCOUNTER — Ambulatory Visit (HOSPITAL_COMMUNITY): Admission: RE | Admit: 2012-12-29 | Payer: PRIVATE HEALTH INSURANCE | Source: Ambulatory Visit

## 2012-12-29 ENCOUNTER — Encounter: Payer: Self-pay | Admitting: *Deleted

## 2013-01-13 ENCOUNTER — Ambulatory Visit (HOSPITAL_COMMUNITY): Payer: PRIVATE HEALTH INSURANCE

## 2013-02-02 ENCOUNTER — Ambulatory Visit: Payer: PRIVATE HEALTH INSURANCE | Admitting: Family Medicine

## 2013-02-23 ENCOUNTER — Emergency Department (HOSPITAL_COMMUNITY)
Admission: EM | Admit: 2013-02-23 | Discharge: 2013-02-23 | Disposition: A | Discharge status: Home / Self Care | Payer: No Typology Code available for payment source | Attending: Emergency Medicine | Admitting: Emergency Medicine

## 2013-02-23 ENCOUNTER — Encounter (HOSPITAL_COMMUNITY): Payer: Self-pay | Admitting: Emergency Medicine

## 2013-02-23 DIAGNOSIS — S0993XA Unspecified injury of face, initial encounter: Secondary | ICD-10-CM | POA: Insufficient documentation

## 2013-02-23 DIAGNOSIS — S0990XA Unspecified injury of head, initial encounter: Secondary | ICD-10-CM | POA: Insufficient documentation

## 2013-02-23 DIAGNOSIS — Y92410 Unspecified street and highway as the place of occurrence of the external cause: Secondary | ICD-10-CM | POA: Insufficient documentation

## 2013-02-23 DIAGNOSIS — Y9389 Activity, other specified: Secondary | ICD-10-CM | POA: Insufficient documentation

## 2013-02-23 MED ORDER — METHOCARBAMOL 500 MG PO TABS
500.0000 mg | ORAL_TABLET | Freq: Once | ORAL | Status: AC
  Administered 2013-02-23: 500 mg via ORAL
  Filled 2013-02-23: qty 1

## 2013-02-23 MED ORDER — METHOCARBAMOL 500 MG PO TABS: 500.0000 mg | ORAL_TABLET | Freq: Two times a day (BID) | ORAL | Status: DC

## 2013-02-23 MED ORDER — IBUPROFEN 800 MG PO TABS: 800.0000 mg | ORAL_TABLET | Freq: Three times a day (TID) | ORAL | Status: DC

## 2013-02-23 MED ORDER — IBUPROFEN 400 MG PO TABS
800.0000 mg | ORAL_TABLET | Freq: Once | ORAL | Status: AC
  Administered 2013-02-23: 800 mg via ORAL
  Filled 2013-02-23: qty 2
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
## 2013-02-23 NOTE — ED Provider Notes (Signed)
Medical screening examination/treatment/procedure(s) were performed by non-physician practitioner and as supervising physician I was immediately available for consultation/collaboration.  EKG Interpretation   None         Horris Speros M Dayja Loveridge, DO 02/23/13 1711 
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
## 2013-06-09 ENCOUNTER — Encounter (HOSPITAL_COMMUNITY): Payer: Self-pay | Admitting: Emergency Medicine

## 2013-06-09 ENCOUNTER — Emergency Department (HOSPITAL_COMMUNITY)
Admission: EM | Admit: 2013-06-09 | Discharge: 2013-06-09 | Disposition: A | Discharge status: Home / Self Care | Payer: BC Managed Care – PPO | Source: Home / Self Care

## 2013-06-09 DIAGNOSIS — R109 Unspecified abdominal pain: Secondary | ICD-10-CM

## 2013-06-09 DIAGNOSIS — A088 Other specified intestinal infections: Secondary | ICD-10-CM

## 2013-06-09 DIAGNOSIS — A084 Viral intestinal infection, unspecified: Secondary | ICD-10-CM

## 2013-06-09 LAB — POCT URINALYSIS DIP (DEVICE)
Bilirubin Urine: NEGATIVE
Glucose, UA: NEGATIVE mg/dL
KETONES UR: NEGATIVE mg/dL
LEUKOCYTES UA: NEGATIVE
Nitrite: NEGATIVE
PROTEIN: NEGATIVE mg/dL
Specific Gravity, Urine: 1.01 (ref 1.005–1.030)
UROBILINOGEN UA: 0.2 mg/dL (ref 0.0–1.0)
pH: 7 (ref 5.0–8.0)

## 2013-06-09 LAB — POCT PREGNANCY, URINE: PREG TEST UR: NEGATIVE

## 2013-06-09 MED ORDER — ONDANSETRON 4 MG PO TBDP: 4.0000 mg | ORAL_TABLET | Freq: Three times a day (TID) | ORAL | Status: DC | PRN

## 2013-06-09 NOTE — ED Provider Notes (Signed)
Medical screening examination/treatment/procedure(s) were performed by a resident physician and as supervising physician I was immediately available for consultation/collaboration.  Philipp Deputy, M.D.  Harden Mo, MD 06/09/13 [phone]

## 2013-06-09 NOTE — Discharge Instructions (Signed)
You are likely suffering from viral gastroenteritis Please use the Zofran PRN for nausea Please get lots of rest and stay well hydrated Please follow up as needed Please take another pregnancy test in 2 weeks

## 2013-06-09 NOTE — ED Notes (Signed)
Patient complains of abd pain and tingling in breasts x 1 week with nausea; states had regular period last month 2/11 then states her period came a week earlier in 3/2. States feeling weak.

## 2013-06-09 NOTE — ED Provider Notes (Signed)
CSN: [id number]     Arrival date & time 06/09/13  2000 History   None    Chief Complaint  Patient presents with  . Abdominal Pain   (Consider location/radiation/quality/duration/timing/severity/associated sxs/prior Treatment) HPI  Abdominal pain: started last week more as nausea and associated w/ breast fullness that lasted for 3 days then resolved. Occasional morning and afternoon nausea. Started period of 05/20/13 and then again on 06/07/13. Zofran x1 w/ some benefit. Denies dysuria, frequency, fevers, hematochezia. Works on [employer] at hospital. Sexually active. Daily soft BM. Denies hematochezia. Tolerating PO but decreased appetite. Denies reflux. Pain not exacerbated by food.     Past Medical History  Diagnosis Date  . Nonspecific reaction to tuberculin skin test without active tuberculosis(795.51) 05/26/2008    Qualifier: Diagnosis of  By: Drue Flirt  MD, Merrily Brittle     History reviewed. No pertinent past surgical history. History reviewed. No pertinent family history. History  Substance Use Topics  . Smoking status: Never Smoker   . Smokeless tobacco: Not on file  . Alcohol Use: No   OB History   Grav Para Term Preterm Abortions TAB SAB Ect Mult Living                 Review of Systems  Constitutional: Positive for activity change and appetite change. Negative for fever.  Gastrointestinal: Positive for abdominal pain. Negative for diarrhea, constipation and blood in stool.    Allergies  Review of patient's allergies indicates no known allergies.  Home Medications   Current Outpatient Rx  Name  Route  Sig  Dispense  Refill  . ibuprofen (ADVIL,MOTRIN) 800 MG tablet   Oral   Take 1 tablet (800 mg total) by mouth 3 (three) times daily.   21 tablet   0   . methocarbamol (ROBAXIN) 500 MG tablet   Oral   Take 1 tablet (500 mg total) by mouth 2 (two) times daily.   20 tablet   0   . ondansetron (ZOFRAN-ODT) 4 MG disintegrating tablet   Oral   Take 1 tablet (4 mg total) by  mouth every 8 (eight) hours as needed for nausea or vomiting.   30 tablet   0    BP 118/82  Pulse 80  Temp(Src) 97.7 F (36.5 C) (Oral)  Resp 16  SpO2 100%  LMP 06/09/2013 Physical Exam  Constitutional: She is oriented to person, place, and time. She appears well-developed and well-nourished. No distress.  HENT:  Head: Normocephalic and atraumatic.  Eyes: EOM are normal. Pupils are equal, round, and reactive to light.  Neck: Normal range of motion. Neck supple.  Cardiovascular: Normal rate, normal heart sounds and intact distal pulses.  Exam reveals no gallop and no friction rub.   No murmur heard. Pulmonary/Chest: Effort normal and breath sounds normal. No respiratory distress.  Abdominal: Soft. Bowel sounds are normal. She exhibits no distension. There is no rebound.  Nonttp. Murphy sign neg. No pain at mcburny's point  Musculoskeletal: Normal range of motion. She exhibits no tenderness.  Neurological: She is alert and oriented to person, place, and time.  Skin: No rash noted. She is not diaphoretic.  Psychiatric: She has a normal mood and affect. Her behavior is normal. Judgment and thought content normal.    ED Course  Procedures (including critical care time) Labs Review Labs Reviewed  POCT URINALYSIS DIP (DEVICE) - Abnormal; Notable for the following:    Hgb urine dipstick LARGE (*)    All other components within normal limits  POCT PREGNANCY, URINE   Imaging Review No results found.    MDM   1. Abdominal pain   2. Viral gastroenteritis     Symptoms likely secondary to viral gastro but cannot r/o early pregnancy (though unlikely given neg urine preg).  - zofran ODT, fluids and rest - precautions given and all questions answered  Linna Darner, MD Family Medicine PGY-3 06/09/2013, 10:01 PM    Waldemar Dickens, MD 06/09/13 2202

## 2016-03-17 ENCOUNTER — Ambulatory Visit (HOSPITAL_COMMUNITY)
Admission: EM | Admit: 2016-03-17 | Discharge: 2016-03-17 | Disposition: A | Discharge status: Home / Self Care | Payer: 59 | Attending: Family Medicine | Admitting: Family Medicine

## 2016-03-17 ENCOUNTER — Encounter (HOSPITAL_COMMUNITY): Payer: Self-pay | Admitting: Emergency Medicine

## 2016-03-17 DIAGNOSIS — N3000 Acute cystitis without hematuria: Secondary | ICD-10-CM | POA: Diagnosis not present

## 2016-03-17 LAB — POCT URINALYSIS DIP (DEVICE)
Bilirubin Urine: NEGATIVE
Glucose, UA: NEGATIVE mg/dL
Ketones, ur: NEGATIVE mg/dL
NITRITE: NEGATIVE
PH: 6 (ref 5.0–8.0)
PROTEIN: NEGATIVE mg/dL
Specific Gravity, Urine: 1.015 (ref 1.005–1.030)
UROBILINOGEN UA: 0.2 mg/dL (ref 0.0–1.0)

## 2016-03-17 LAB — POCT PREGNANCY, URINE: PREG TEST UR: NEGATIVE

## 2016-03-17 MED ORDER — CIPROFLOXACIN HCL 250 MG PO TABS: ORAL_TABLET | ORAL | 0 refills | Status: DC

## 2016-03-17 NOTE — ED Provider Notes (Signed)
CSN: [id number]     Arrival date & time 03/17/16  1920 History   None    Chief Complaint  Patient presents with  . Urinary Tract Infection   (Consider location/radiation/quality/duration/timing/severity/associated sxs/prior Treatment) Patient presents with a 12 hour history of upper back pain, and urinary frequency. She has no fevers, chill, N, V. No abdominal pain.       Past Medical History:  Diagnosis Date  . Nonspecific reaction to tuberculin skin test without active tuberculosis(795.51) 05/26/2008   Qualifier: Diagnosis of  By: Drue Flirt  MD, Merrily Brittle     History reviewed. No pertinent surgical history. History reviewed. No pertinent family history. Social History  Substance Use Topics  . Smoking status: Never Smoker  . Smokeless tobacco: Never Used  . Alcohol use No   OB History    No data available     Review of Systems  All other systems reviewed and are negative.   Allergies  Patient has no known allergies.  Home Medications   Prior to Admission medications   Medication Sig Start Date End Date Taking? Authorizing Provider  ciprofloxacin (CIPRO) 250 MG tablet 1 po bid x 3 days 03/17/16   Bjorn Pippin, PA-C  ibuprofen (ADVIL,MOTRIN) 800 MG tablet Take 1 tablet (800 mg total) by mouth 3 (three) times daily. 02/23/13   Domenic Moras, PA-C  methocarbamol (ROBAXIN) 500 MG tablet Take 1 tablet (500 mg total) by mouth 2 (two) times daily. 02/23/13   Domenic Moras, PA-C  ondansetron (ZOFRAN-ODT) 4 MG disintegrating tablet Take 1 tablet (4 mg total) by mouth every 8 (eight) hours as needed for nausea or vomiting. 06/09/13   Waldemar Dickens, MD   Meds Ordered and Administered this Visit  Medications - No data to display  BP 117/76 (BP Location: Left Arm)   Pulse 85   Temp 98.5 F (36.9 C) (Oral)   Resp 16   LMP 02/27/2016   SpO2 100%  No data found.   Physical Exam  Constitutional: She is oriented to person, place, and time. She appears well-developed and  well-nourished. No distress.  Abdominal:  Pain with percussion of bilateral flank  Neurological: She is alert and oriented to person, place, and time.  Skin: Skin is warm and dry. She is not diaphoretic.  Psychiatric: Her behavior is normal.  Nursing note and vitals reviewed.   Urgent Care Course   Clinical Course     Procedures (including critical care time)  Labs Review Labs Reviewed  POCT URINALYSIS DIP (DEVICE) - Abnormal; Notable for the following:       Result Value   Hgb urine dipstick TRACE (*)    Leukocytes, UA SMALL (*)    All other components within normal limits  POCT PREGNANCY, URINE    Imaging Review No results found.   Visual Acuity Review  Right Eye Distance:   Left Eye Distance:   Bilateral Distance:    Right Eye Near:   Left Eye Near:    Bilateral Near:         MDM   1. Acute cystitis without hematuria   Treat with antibiotics, hydration. F/U as needed.     Bjorn Pippin, PA-C 03/17/16 Thoreau Nargis Abrams, PA-C 03/17/16 2003

## 2016-03-17 NOTE — ED Triage Notes (Signed)
Here for UIT sx onset this am associated w/urinary freq/urgency and back pain  Denies vag d/c, abd pain, fevers, n/v, dysuria  A&O x4... NAD

## 2016-03-17 NOTE — Discharge Instructions (Signed)
Nice to meet you. You have a urinary tract infection as we discussed. Treat with 3 days of Cipro. Drink lots of fluid and feel better.

## 2016-03-17 NOTE — ED Provider Notes (Signed)
Phil Campbell    CSN: [id number] Arrival date & time: 03/17/16  1920     History   Chief Complaint Chief Complaint  Patient presents with  . Urinary Tract Infection    Sandra Taylor is a 30 y.o. female.   Sandra  Past Medical History:  Diagnosis Date  . Nonspecific reaction to tuberculin skin test without active tuberculosis(795.51) 05/26/2008   Qualifier: Diagnosis of  By: Drue Flirt  MD, Taineisha      Patient Active Problem List   Diagnosis Date Noted  . UTERINE POLYP 08/11/2007  . ECZEMA 08/11/2007  . MENORRHAGIA 02/24/2007    History reviewed. No pertinent surgical history.  OB History    No data available       Home Medications    Prior to Admission medications   Medication Sig Start Date End Date Taking? Authorizing Provider  ibuprofen (ADVIL,MOTRIN) 800 MG tablet Take 1 tablet (800 mg total) by mouth 3 (three) times daily. 02/23/13   Domenic Moras, PA-C  methocarbamol (ROBAXIN) 500 MG tablet Take 1 tablet (500 mg total) by mouth 2 (two) times daily. 02/23/13   Domenic Moras, PA-C  ondansetron (ZOFRAN-ODT) 4 MG disintegrating tablet Take 1 tablet (4 mg total) by mouth every 8 (eight) hours as needed for nausea or vomiting. 06/09/13   Waldemar Dickens, MD    Family History History reviewed. No pertinent family history.  Social History Social History  Substance Use Topics  . Smoking status: Never Smoker  . Smokeless tobacco: Never Used  . Alcohol use No     Allergies   Patient has no known allergies.   Review of Systems Review of Systems   Physical Exam Triage Vital Signs ED Triage Vitals [03/17/16 1933]  Enc Vitals Group     BP 117/76     Pulse Rate 85     Resp 16     Temp 98.5 F (36.9 C)     Temp Source Oral     SpO2 100 %     Weight      Height      Head Circumference      Peak Flow      Pain Score      Pain Loc      Pain Edu?      Excl. in [place]?    No data found.   Updated Vital Signs BP 117/76 (BP Location: Left Arm)    Pulse 85   Temp 98.5 F (36.9 C) (Oral)   Resp 16   LMP 02/27/2016   SpO2 100%   Visual Acuity Right Eye Distance:   Left Eye Distance:   Bilateral Distance:    Right Eye Near:   Left Eye Near:    Bilateral Near:     Physical Exam   UC Treatments / Results  Labs (all labs ordered are listed, but only abnormal results are displayed) Labs Reviewed  POCT URINALYSIS DIP (DEVICE) - Abnormal; Notable for the following:       Result Value   Hgb urine dipstick TRACE (*)    Leukocytes, UA SMALL (*)    All other components within normal limits  POCT PREGNANCY, URINE    EKG  EKG Interpretation None       Radiology No results found.  Procedures Procedures (including critical care time)  Medications Ordered in UC Medications - No data to display   Initial Impression / Assessment and Plan / UC Course  I have reviewed the triage  vital signs and the nursing notes.  Pertinent labs & imaging results that were available during my care of the patient were reviewed by me and considered in my medical decision making (see chart for details).  Clinical Course       Final Clinical Impressions(s) / UC Diagnoses   Final diagnoses:  None    New Prescriptions New Prescriptions   No medications on file     Billy Fischer, MD 03/17/16 2024

## 2016-03-19 ENCOUNTER — Other Ambulatory Visit: Payer: Self-pay | Admitting: Obstetrics and Gynecology

## 2016-03-20 DIAGNOSIS — Z118 Encounter for screening for other infectious and parasitic diseases: Secondary | ICD-10-CM | POA: Diagnosis not present

## 2016-03-20 DIAGNOSIS — Z113 Encounter for screening for infections with a predominantly sexual mode of transmission: Secondary | ICD-10-CM | POA: Diagnosis not present

## 2016-03-20 DIAGNOSIS — M549 Dorsalgia, unspecified: Secondary | ICD-10-CM | POA: Diagnosis not present

## 2016-03-20 DIAGNOSIS — R350 Frequency of micturition: Secondary | ICD-10-CM | POA: Diagnosis not present

## 2016-06-07 DIAGNOSIS — L659 Nonscarring hair loss, unspecified: Secondary | ICD-10-CM | POA: Diagnosis not present

## 2016-06-07 DIAGNOSIS — L709 Acne, unspecified: Secondary | ICD-10-CM | POA: Diagnosis not present

## 2016-10-09 DIAGNOSIS — Z6823 Body mass index (BMI) 23.0-23.9, adult: Secondary | ICD-10-CM | POA: Diagnosis not present

## 2016-10-09 DIAGNOSIS — Z01419 Encounter for gynecological examination (general) (routine) without abnormal findings: Secondary | ICD-10-CM | POA: Diagnosis not present

## 2016-10-09 DIAGNOSIS — Z1151 Encounter for screening for human papillomavirus (HPV): Secondary | ICD-10-CM | POA: Diagnosis not present

## 2016-11-17 DIAGNOSIS — W57XXXA Bitten or stung by nonvenomous insect and other nonvenomous arthropods, initial encounter: Secondary | ICD-10-CM | POA: Diagnosis not present

## 2016-11-17 DIAGNOSIS — L089 Local infection of the skin and subcutaneous tissue, unspecified: Secondary | ICD-10-CM | POA: Diagnosis not present

## 2016-11-17 DIAGNOSIS — S50862A Insect bite (nonvenomous) of left forearm, initial encounter: Secondary | ICD-10-CM | POA: Diagnosis not present

## 2016-12-18 DIAGNOSIS — H5203 Hypermetropia, bilateral: Secondary | ICD-10-CM | POA: Diagnosis not present

## 2016-12-18 DIAGNOSIS — H40019 Open angle with borderline findings, low risk, unspecified eye: Secondary | ICD-10-CM | POA: Diagnosis not present

## 2017-01-23 DIAGNOSIS — H40023 Open angle with borderline findings, high risk, bilateral: Secondary | ICD-10-CM | POA: Diagnosis not present

## 2018-04-03 DIAGNOSIS — N841 Polyp of cervix uteri: Secondary | ICD-10-CM | POA: Diagnosis not present

## 2018-04-03 DIAGNOSIS — Z13220 Encounter for screening for lipoid disorders: Secondary | ICD-10-CM | POA: Diagnosis not present

## 2018-04-03 DIAGNOSIS — N840 Polyp of corpus uteri: Secondary | ICD-10-CM | POA: Diagnosis not present

## 2018-04-03 DIAGNOSIS — Z1151 Encounter for screening for human papillomavirus (HPV): Secondary | ICD-10-CM | POA: Diagnosis not present

## 2018-04-03 DIAGNOSIS — Z131 Encounter for screening for diabetes mellitus: Secondary | ICD-10-CM | POA: Diagnosis not present

## 2018-04-03 DIAGNOSIS — Z0000 Encounter for general adult medical examination without abnormal findings: Secondary | ICD-10-CM | POA: Diagnosis not present

## 2018-04-03 DIAGNOSIS — Z01419 Encounter for gynecological examination (general) (routine) without abnormal findings: Secondary | ICD-10-CM | POA: Diagnosis not present

## 2018-04-03 DIAGNOSIS — Z130 Encounter for screening for diseases of the blood and blood-forming organs and certain disorders involving the immune mechanism: Secondary | ICD-10-CM | POA: Diagnosis not present

## 2018-04-03 DIAGNOSIS — Z1329 Encounter for screening for other suspected endocrine disorder: Secondary | ICD-10-CM | POA: Diagnosis not present

## 2018-04-03 DIAGNOSIS — Z6825 Body mass index (BMI) 25.0-25.9, adult: Secondary | ICD-10-CM | POA: Diagnosis not present

## 2018-05-27 DIAGNOSIS — H40023 Open angle with borderline findings, high risk, bilateral: Secondary | ICD-10-CM | POA: Diagnosis not present

## 2018-09-04 DIAGNOSIS — L709 Acne, unspecified: Secondary | ICD-10-CM | POA: Diagnosis not present

## 2018-09-04 DIAGNOSIS — Z0000 Encounter for general adult medical examination without abnormal findings: Secondary | ICD-10-CM | POA: Diagnosis not present

## 2018-09-18 DIAGNOSIS — L709 Acne, unspecified: Secondary | ICD-10-CM | POA: Diagnosis not present

## 2018-09-18 DIAGNOSIS — L810 Postinflammatory hyperpigmentation: Secondary | ICD-10-CM | POA: Diagnosis not present

## 2018-11-26 DIAGNOSIS — F430 Acute stress reaction: Secondary | ICD-10-CM | POA: Diagnosis not present

## 2018-11-26 DIAGNOSIS — G4700 Insomnia, unspecified: Secondary | ICD-10-CM | POA: Diagnosis not present

## 2018-12-02 DIAGNOSIS — L709 Acne, unspecified: Secondary | ICD-10-CM | POA: Diagnosis not present

## 2018-12-02 DIAGNOSIS — L810 Postinflammatory hyperpigmentation: Secondary | ICD-10-CM | POA: Diagnosis not present

## 2019-01-06 DIAGNOSIS — Z5181 Encounter for therapeutic drug level monitoring: Secondary | ICD-10-CM | POA: Diagnosis not present

## 2019-01-07 DIAGNOSIS — L709 Acne, unspecified: Secondary | ICD-10-CM | POA: Diagnosis not present

## 2019-01-07 MED FILL — MYORISAN 40 MG CAPSULE: 40 | 30 days supply | Qty: 30 | Fill #0

## 2019-01-16 DIAGNOSIS — R300 Dysuria: Secondary | ICD-10-CM | POA: Diagnosis not present

## 2019-01-16 MED FILL — NITROFURANTOIN MONO-MCR 100: 100 | 5 days supply | Qty: 10 | Fill #0

## 2019-02-04 DIAGNOSIS — H40023 Open angle with borderline findings, high risk, bilateral: Secondary | ICD-10-CM | POA: Diagnosis not present

## 2019-02-09 DIAGNOSIS — K130 Diseases of lips: Secondary | ICD-10-CM | POA: Diagnosis not present

## 2019-02-09 DIAGNOSIS — L700 Acne vulgaris: Secondary | ICD-10-CM | POA: Diagnosis not present

## 2019-02-09 DIAGNOSIS — L709 Acne, unspecified: Secondary | ICD-10-CM | POA: Diagnosis not present

## 2019-02-09 DIAGNOSIS — Z5181 Encounter for therapeutic drug level monitoring: Secondary | ICD-10-CM | POA: Diagnosis not present

## 2019-02-10 MED FILL — CLARAVIS 40 MG CAPSULE: 40 | 30 days supply | Qty: 30 | Fill #0

## 2019-03-11 DIAGNOSIS — L709 Acne, unspecified: Secondary | ICD-10-CM | POA: Diagnosis not present

## 2019-03-11 DIAGNOSIS — K130 Diseases of lips: Secondary | ICD-10-CM | POA: Diagnosis not present

## 2019-03-11 DIAGNOSIS — Z5181 Encounter for therapeutic drug level monitoring: Secondary | ICD-10-CM | POA: Diagnosis not present

## 2019-03-12 MED FILL — CLARAVIS 40 MG CAPSULE: 40 | 30 days supply | Qty: 30 | Fill #0

## 2019-03-25 DIAGNOSIS — H40013 Open angle with borderline findings, low risk, bilateral: Secondary | ICD-10-CM | POA: Diagnosis not present

## 2019-04-15 DIAGNOSIS — K130 Diseases of lips: Secondary | ICD-10-CM | POA: Diagnosis not present

## 2019-04-15 DIAGNOSIS — L709 Acne, unspecified: Secondary | ICD-10-CM | POA: Diagnosis not present

## 2019-04-15 DIAGNOSIS — Z5181 Encounter for therapeutic drug level monitoring: Secondary | ICD-10-CM | POA: Diagnosis not present

## 2019-04-20 MED FILL — CLARAVIS 40 MG CAPSULE: 40 | 30 days supply | Qty: 30 | Fill #0

## 2019-05-25 DIAGNOSIS — L709 Acne, unspecified: Secondary | ICD-10-CM | POA: Diagnosis not present

## 2019-05-25 DIAGNOSIS — K130 Diseases of lips: Secondary | ICD-10-CM | POA: Diagnosis not present

## 2019-05-25 DIAGNOSIS — L700 Acne vulgaris: Secondary | ICD-10-CM | POA: Diagnosis not present

## 2019-05-25 DIAGNOSIS — Z5181 Encounter for therapeutic drug level monitoring: Secondary | ICD-10-CM | POA: Diagnosis not present

## 2019-05-25 DIAGNOSIS — B351 Tinea unguium: Secondary | ICD-10-CM | POA: Diagnosis not present

## 2019-05-27 MED FILL — CLARAVIS 40 MG CAPSULE: 40 | 30 days supply | Qty: 30 | Fill #0

## 2019-06-10 ENCOUNTER — Encounter: Payer: Self-pay | Admitting: *Deleted

## 2019-06-26 ENCOUNTER — Telehealth: Payer: Self-pay

## 2019-06-26 NOTE — Telephone Encounter (Signed)
Per Dr. Denna Haggard contact patient regarding her culture results and inform her that her culture did grow a type of yeast. It's impossible to say if it's cause of nail issue, option= custom topical Fluconazole x 2 months daily.  Phone call to patient to give her culture results and Dr. Onalee Hua recommendations, patient states she has an appointment with Dr. Denna Haggard on 06/29/2019 and at that time they can discuss everything.

## 2019-06-29 ENCOUNTER — Encounter: Payer: Self-pay | Admitting: Dermatology

## 2019-06-29 ENCOUNTER — Other Ambulatory Visit: Payer: Self-pay

## 2019-06-29 ENCOUNTER — Ambulatory Visit (INDEPENDENT_AMBULATORY_CARE_PROVIDER_SITE_OTHER): Payer: 59 | Admitting: Dermatology

## 2019-06-29 DIAGNOSIS — L700 Acne vulgaris: Secondary | ICD-10-CM | POA: Diagnosis not present

## 2019-06-29 DIAGNOSIS — K649 Unspecified hemorrhoids: Secondary | ICD-10-CM | POA: Diagnosis not present

## 2019-06-29 DIAGNOSIS — R350 Frequency of micturition: Secondary | ICD-10-CM | POA: Diagnosis not present

## 2019-06-29 MED ORDER — ISOTRETINOIN 40 MG PO CAPS: 40.0000 mg | ORAL_CAPSULE | Freq: Every day | ORAL | 0 refills | Status: DC

## 2019-06-29 NOTE — Progress Notes (Signed)
   Follow-Up Visit   Subjective  Sandra Taylor is a 34 y.o. female who presents for the following: Acne (Isotret follow-up).  acne Location: face Duration: years Quality: clear Associated Signs/Symptoms: Modifying Factors: isotretinoin Severity: now 0/10 Timing: Context:   The following portions of the chart were reviewed this encounter and updated as appropriate:     Objective  Well appearing patient in no apparent distress; mood and affect are within normal limits.  A focused examination was performed including face. Relevant physical exam findings are noted in the Assessment and Plan. Acne remains clear Sandra Taylor and she will reach the [place] target dose with 1 more prescription after today. We discussed the I pledge compliance when she is done with medication. Her thumbnail is actually improving so we discussed oral and topical anti-Candida options but none was initiated. We also discussed future treatment options for facial hair.  Assessment & Plan  Acne vulgaris (3) Mid Forehead; Left Buccal Cheek ; Right Buccal Cheek   Continue isotretinoin to reach target dose; sun protection

## 2019-06-30 LAB — PREGNANCY, URINE: Preg Test, Ur: NEGATIVE

## 2019-07-03 ENCOUNTER — Telehealth: Payer: Self-pay

## 2019-07-03 NOTE — Telephone Encounter (Signed)
Phone call from patient stating the pharmacy did not have her Isotretinoin prescription.  Told her that it was sent to the [facility] and there was confirmation they received it.  She thought she had mentioned to send it to [facility]. Stated she would pick it up from [facility].

## 2019-07-28 ENCOUNTER — Other Ambulatory Visit: Payer: Self-pay

## 2019-07-28 ENCOUNTER — Encounter: Payer: Self-pay | Admitting: Dermatology

## 2019-07-28 ENCOUNTER — Ambulatory Visit: Payer: 59 | Admitting: Dermatology

## 2019-07-28 DIAGNOSIS — L700 Acne vulgaris: Secondary | ICD-10-CM

## 2019-07-28 DIAGNOSIS — Z5181 Encounter for therapeutic drug level monitoring: Secondary | ICD-10-CM

## 2019-07-28 MED ORDER — ISOTRETINOIN 40 MG PO CAPS
40.0000 mg | ORAL_CAPSULE | Freq: Every day | ORAL | 0 refills | Status: DC
Start: 2019-07-28 — End: 2019-10-27

## 2019-07-29 LAB — PREGNANCY, URINE: Preg Test, Ur: NEGATIVE

## 2019-08-01 ENCOUNTER — Encounter: Payer: Self-pay | Admitting: Dermatology

## 2019-08-01 NOTE — Progress Notes (Signed)
   Follow-Up Visit   Subjective  Sandra Taylor is a 34 y.o. female who presents for the following: Acne (Patient here today for 31day Isotret follow up).  Acne Location: Face Duration: Years Quality: Clear Associated Signs/Symptoms: Modifying Factors: Isotretinoin Severity:  Timing: Context:   The following portions of the chart were reviewed this encounter and updated as appropriate: Tobacco  Allergies  Meds  Problems  Med Hx  Surg Hx  Fam Hx      Objective  Well appearing patient in no apparent distress; mood and affect are within normal limits.  A focused examination was performed including face, neck. Relevant physical exam findings are noted in the Assessment and Plan.   Assessment & Plan  Acne vulgaris (2) Left Buccal Cheek ; Right Buccal Cheek   Finish isotretinoin, avoid sunburn and pregnancy  Other Related Procedures Pregnancy, urine  Reordered Medications ISOtretinoin (ACCUTANE) 40 MG capsule  Encounter for therapeutic drug monitoring  Other Related Procedures Pregnancy, urine  Reordered Medications ISOtretinoin (ACCUTANE) 40 MG capsule

## 2019-10-27 ENCOUNTER — Other Ambulatory Visit: Payer: Self-pay

## 2019-10-27 ENCOUNTER — Ambulatory Visit: Payer: 59 | Admitting: Dermatology

## 2019-10-27 ENCOUNTER — Encounter: Payer: Self-pay | Admitting: Dermatology

## 2019-10-27 DIAGNOSIS — L710 Perioral dermatitis: Secondary | ICD-10-CM

## 2019-10-27 MED ORDER — MINOCYCLINE HCL 50 MG PO CAPS: 50.0000 mg | ORAL_CAPSULE | Freq: Two times a day (BID) | ORAL | 0 refills | Status: AC

## 2019-10-27 MED ORDER — HYDROCORTISONE 1 % EX LOTN: 1.0000 "application " | TOPICAL_LOTION | Freq: Every day | CUTANEOUS | 1 refills | Status: DC

## 2019-10-27 NOTE — Patient Instructions (Addendum)
Apply lotion daily for 2 weeks.  If area has not cleared up then, pick up Minocycline prescription.  Let us know how the therapy is helping.   Perioral dermatitis  Perioral dermatitis is an eruption which is usually located around the mouth and nose.  It can be a rash and/or red bumps.  It occasionally occurs around the eyes.  It may be itchy and may burn.  The exact cause is unknown.  Some types of makeup, moisturizers, dental products, and prescription creams may be partially responsible for the eruption.  Topical steroids such as cortisone creams can temporarily make the rash better but with discontinuation the rash tends to recur and worsen.  If you have been using topical steroids, your dermatologist may need to gradually taper the strength of steroids.  Topical antibiotics, elidel cream, protopic ointment, and oral antiobiotics may be prescribed to treat this condition.  Although perioral dermatitis is not an infection, some antibiotics have anti-inflammatory properties that help it greatly.

## 2019-12-01 ENCOUNTER — Encounter: Payer: Self-pay | Admitting: Dermatology

## 2019-12-01 NOTE — Progress Notes (Signed)
   Follow-Up Visit   Subjective  Sandra Taylor is a 34 y.o. female who presents for the following: Rash (around mouth and chin, finished accutane in April, this started in mid May, denies new lotions or creams).  Rash Location: Lower face Duration:  Quality:  Associated Signs/Symptoms: Modifying Factors: Finished course of extranodal and April, this began 1 to 2 months later Severity:  Timing: Context:   Objective  Well appearing patient in no apparent distress; mood and affect are within normal limits.  Focused examination of the head, neck, and intraoral.  Assessment & Plan    Perioral dermatitis (3) Mid Lower Cutaneous Lip; Left Buccal Cheek ; Right Buccal Cheek   With this being recent onset of initially try a mild topical cortisone to see if it responds as a contact dermatitis.  Should this produce no improvement within 2 to 3 weeks, she may get low-dose oral minocycline.      I, Lavonna Monarch, MD, have reviewed all documentation for this visit.  The documentation on 12/01/19 for the exam, diagnosis, procedures, and orders are all accurate and complete.

## 2020-10-25 DIAGNOSIS — Z13220 Encounter for screening for lipoid disorders: Secondary | ICD-10-CM | POA: Diagnosis not present

## 2020-10-25 DIAGNOSIS — Z131 Encounter for screening for diabetes mellitus: Secondary | ICD-10-CM | POA: Diagnosis not present

## 2020-10-25 DIAGNOSIS — Z124 Encounter for screening for malignant neoplasm of cervix: Secondary | ICD-10-CM | POA: Diagnosis not present

## 2020-10-25 DIAGNOSIS — Z0000 Encounter for general adult medical examination without abnormal findings: Secondary | ICD-10-CM | POA: Diagnosis not present

## 2020-10-25 DIAGNOSIS — Z3169 Encounter for other general counseling and advice on procreation: Secondary | ICD-10-CM | POA: Diagnosis not present

## 2020-10-25 DIAGNOSIS — N841 Polyp of cervix uteri: Secondary | ICD-10-CM | POA: Diagnosis not present

## 2020-10-25 DIAGNOSIS — Z1329 Encounter for screening for other suspected endocrine disorder: Secondary | ICD-10-CM | POA: Diagnosis not present

## 2020-10-25 DIAGNOSIS — Z6824 Body mass index (BMI) 24.0-24.9, adult: Secondary | ICD-10-CM | POA: Diagnosis not present

## 2020-10-25 DIAGNOSIS — Z01419 Encounter for gynecological examination (general) (routine) without abnormal findings: Secondary | ICD-10-CM | POA: Diagnosis not present

## 2020-11-08 DIAGNOSIS — Z0000 Encounter for general adult medical examination without abnormal findings: Secondary | ICD-10-CM | POA: Diagnosis not present

## 2020-11-29 ENCOUNTER — Ambulatory Visit: Payer: 59 | Admitting: Dermatology

## 2020-11-29 ENCOUNTER — Other Ambulatory Visit: Payer: Self-pay

## 2020-11-29 ENCOUNTER — Encounter: Payer: Self-pay | Admitting: Dermatology

## 2020-11-29 DIAGNOSIS — L700 Acne vulgaris: Secondary | ICD-10-CM

## 2020-11-29 MED ORDER — ARAZLO 0.045 % EX LOTN: TOPICAL_LOTION | CUTANEOUS | 1 refills | Status: DC

## 2020-12-11 ENCOUNTER — Encounter: Payer: Self-pay | Admitting: Dermatology

## 2020-12-11 NOTE — Progress Notes (Signed)
   Follow-Up Visit   Subjective  Sandra Taylor is a 35 y.o. female who presents for the following: Skin Problem (New spots on left and right cheek- x 3 months- + itch- seemed to be raised at first but no so much now).  Spots on face Location:  Duration:  Quality:  Associated Signs/Symptoms: Modifying Factors:  Severity:  Timing: Context:   Objective  Well appearing patient in no apparent distress; mood and affect are within normal limits. Head Patient has 2 acneiform spots on the right cheek that are now somewhat improved     A focused examination was performed including head and neck. Relevant physical exam findings are noted in the Assessment and Plan.   Assessment & Plan    Acne vulgaris Head  Patient advised to repeat over the counter hydrocortisone or pick up the prescription for breakouts.  Told that Arazlo may help both skin texture and hyperpigmentation as well as acne, but daily use may cause some irritation.  I also discussed with patient her perfectionism and non-objective criticism of her own appearance.  Tazarotene (ARAZLO) 0.045 % LOTN - Head Apply to acne daily      I, Lavonna Monarch, MD, have reviewed all documentation for this visit.  The documentation on 12/11/20 for the exam, diagnosis, procedures, and orders are all accurate and complete.

## 2021-04-05 DIAGNOSIS — R109 Unspecified abdominal pain: Secondary | ICD-10-CM | POA: Diagnosis not present

## 2021-04-05 DIAGNOSIS — R350 Frequency of micturition: Secondary | ICD-10-CM | POA: Diagnosis not present

## 2021-04-05 DIAGNOSIS — R8290 Unspecified abnormal findings in urine: Secondary | ICD-10-CM | POA: Diagnosis not present

## 2021-04-05 DIAGNOSIS — Z3200 Encounter for pregnancy test, result unknown: Secondary | ICD-10-CM | POA: Diagnosis not present

## 2021-04-05 DIAGNOSIS — D259 Leiomyoma of uterus, unspecified: Secondary | ICD-10-CM | POA: Diagnosis not present

## 2021-04-09 NOTE — L&D Delivery Note (Signed)
OB/GYN Faculty Practice Delivery Note  Sandra Taylor is a 36 y.o. G1P0 s/p SVD at [redacted]w[redacted]d She was admitted for IOL postdates.   ROM: 15h 38mith clear fluid GBS Status:  Positive/-- (07/10 0912) Maximum Maternal Temperature: 99.75F  Labor Progress: Pitocin, Cytotec. She then progressed to complete.   Delivery Date/Time: 11/20/21 1344 Delivery: Called to room and patient was complete and pushing. Head delivered ROA. Nuchal cord present x1. Shoulder and body delivered in usual fashion. Infant with spontaneous cry, placed on mother's abdomen, dried and stimulated. Cord clamped x 2 after 1-minute delay, and cut by Grandmother. Cord blood drawn. Placenta delivered spontaneously with gentle cord traction. Fundus firm with massage and Pitocin. Labia, perineum, vagina, and cervix inspected with 3a perineal laceration.  Baby Weight: pending  Placenta: 3 vessel, intact. Sent to L&D Complications: None Lacerations: 3a, repaired EBL: 636 mL Analgesia: Epidural   Infant:  APGAR (1 MIN): 8 APGAR (5 MINS):  9 [place] CrestDO OB Family Medicine Fellow, [facility] [employer] [facility] 11/20/2021, 2:21 PM

## 2021-04-24 ENCOUNTER — Other Ambulatory Visit: Payer: Self-pay

## 2021-04-24 LAB — OB RESULTS CONSOLE HEPATITIS B SURFACE ANTIGEN: Hepatitis B Surface Ag: NEGATIVE

## 2021-05-06 ENCOUNTER — Encounter (HOSPITAL_COMMUNITY): Payer: Self-pay | Admitting: *Deleted

## 2021-05-06 ENCOUNTER — Inpatient Hospital Stay (HOSPITAL_COMMUNITY): Payer: Medicaid Other

## 2021-05-06 ENCOUNTER — Inpatient Hospital Stay (HOSPITAL_COMMUNITY)
Admission: AD | Admit: 2021-05-06 | Discharge: 2021-05-06 | Disposition: A | Discharge status: Home / Self Care | Payer: Medicaid Other | Attending: Obstetrics & Gynecology | Admitting: Obstetrics & Gynecology

## 2021-05-06 DIAGNOSIS — D251 Intramural leiomyoma of uterus: Secondary | ICD-10-CM | POA: Insufficient documentation

## 2021-05-06 DIAGNOSIS — O4411 Placenta previa with hemorrhage, first trimester: Secondary | ICD-10-CM | POA: Diagnosis not present

## 2021-05-06 DIAGNOSIS — O3411 Maternal care for benign tumor of corpus uteri, first trimester: Secondary | ICD-10-CM | POA: Diagnosis not present

## 2021-05-06 DIAGNOSIS — O4690 Antepartum hemorrhage, unspecified, unspecified trimester: Secondary | ICD-10-CM

## 2021-05-06 DIAGNOSIS — O26891 Other specified pregnancy related conditions, first trimester: Secondary | ICD-10-CM | POA: Diagnosis present

## 2021-05-06 DIAGNOSIS — Z6790 Unspecified blood type, Rh positive: Secondary | ICD-10-CM | POA: Diagnosis not present

## 2021-05-06 DIAGNOSIS — Z3A12 12 weeks gestation of pregnancy: Secondary | ICD-10-CM | POA: Diagnosis not present

## 2021-05-06 DIAGNOSIS — R109 Unspecified abdominal pain: Secondary | ICD-10-CM | POA: Diagnosis not present

## 2021-05-06 DIAGNOSIS — D259 Leiomyoma of uterus, unspecified: Secondary | ICD-10-CM

## 2021-05-06 HISTORY — DX: Other specified health status: Z78.9

## 2021-05-06 LAB — URINALYSIS, ROUTINE W REFLEX MICROSCOPIC
Bilirubin Urine: NEGATIVE
Glucose, UA: NEGATIVE mg/dL
Ketones, ur: NEGATIVE mg/dL
Nitrite: NEGATIVE
Protein, ur: NEGATIVE mg/dL
Specific Gravity, Urine: 1.013 (ref 1.005–1.030)
pH: 7 (ref 5.0–8.0)

## 2021-05-06 LAB — WET PREP, GENITAL
Clue Cells Wet Prep HPF POC: NONE SEEN
Sperm: NONE SEEN
Trich, Wet Prep: NONE SEEN
WBC, Wet Prep HPF POC: <10 (ref <10)
Yeast Wet Prep HPF POC: NONE SEEN

## 2021-05-06 LAB — POCT PREGNANCY, URINE: Preg Test, Ur: POSITIVE — AB

## 2021-05-06 LAB — ABO/RH: ABO/RH(D): O POS

## 2021-05-06 NOTE — MAU Provider Note (Signed)
History     CSN: [id number]  Arrival date and time: 05/06/21 1739   Event Date/Time   First Provider Initiated Contact with Patient 05/06/21 1853      Chief Complaint  Patient presents with   Vaginal Bleeding   Abdominal Pain   Sandra Taylor is a 36 y.o. G1P0 at [redacted]w[redacted]d who presents to MAU for vaginal bleeding which began this morning. Patient reports this morning after she had a bowel movement and strained to do so that she wiped and saw some pink/brown spotting on the toilet tissue. Patient states she wore a panty liner and continued to see spotting on her panty liner as well as when wiping after using the restroom. Patient states she also had severe cramping earlier and has occasional tightening of her abdomen, but states that there is no pain present at this time. FHR 169 in triage.  Pt denies vaginal discharge/odor/itching. Pt denies N/V, abdominal pain, constipation, diarrhea, or urinary problems. Pt denies fever, chills, fatigue, sweating or changes in appetite. Pt denies SOB or chest pain. Pt denies dizziness, HA, light-headedness, weakness.   OB History     Gravida  1   Para      Term      Preterm      AB      Living         SAB      IAB      Ectopic      Multiple      Live Births              Past Medical History:  Diagnosis Date   Medical history non-contributory    Nonspecific reaction to tuberculin skin test without active tuberculosis(795.51) 05/26/2008   Qualifier: Diagnosis of  By: Drue Flirt  MD, Merrily Brittle      Past Surgical History:  Procedure Laterality Date   NO PAST SURGERIES      Family History  Problem Relation Age of Onset   Diabetes Mother    Hypertension Mother    Diabetes Father        borderline    Social History   Tobacco Use   Smoking status: Never   Smokeless tobacco: Never  Vaping Use   Vaping Use: Never used  Substance Use Topics   Alcohol use: No   Drug use: No    Allergies:  Allergies  Allergen  Reactions   Latex Itching    With gloves    Medications Prior to Admission  Medication Sig Dispense Refill Last Dose   acetaminophen (TYLENOL) 325 MG tablet Take 325 mg by mouth every 6 (six) hours as needed.   05/06/2021 at 1000   Prenatal Vit-Fe Fumarate-FA (MULTIVITAMIN-PRENATAL) 27-0.8 MG TABS tablet Take 1 tablet by mouth daily at 12 noon.   05/06/2021   hydrocortisone 1 % lotion Apply 1 application topically daily. 113 g 1    ibuprofen (ADVIL,MOTRIN) 800 MG tablet Take 1 tablet (800 mg total) by mouth 3 (three) times daily. 21 tablet 0    Tazarotene (ARAZLO) 0.045 % LOTN Apply to acne daily 45 g 1     Review of Systems  Constitutional:  Negative for chills, diaphoresis, fatigue and fever.  Eyes:  Negative for visual disturbance.  Respiratory:  Negative for shortness of breath.   Cardiovascular:  Negative for chest pain.  Gastrointestinal:  Negative for abdominal pain, constipation, diarrhea, nausea and vomiting.  Genitourinary:  Positive for vaginal bleeding. Negative for dysuria, flank pain, frequency, pelvic  pain, urgency and vaginal discharge.  Neurological:  Negative for dizziness, weakness, light-headedness and headaches.   Physical Exam   Blood pressure 120/70, pulse 89, temperature 98.1 F (36.7 C), temperature source Oral, resp. rate 16, height 5\' 5"  (1.651 m), weight 70.4 kg, last menstrual period 02/14/2021, SpO2 98 %.  Patient Vitals for the past 24 hrs:  BP Temp Temp src Pulse Resp SpO2 Height Weight  05/06/21 1808 120/70 98.1 F (36.7 C) Oral 89 16 98 % 5\' 5"  (1.651 m) 70.4 kg   Physical Exam Vitals and nursing note reviewed.  Constitutional:      General: She is not in acute distress.    Appearance: Normal appearance. She is not ill-appearing, toxic-appearing or diaphoretic.  HENT:     Head: Normocephalic and atraumatic.  Pulmonary:     Effort: Pulmonary effort is normal.  Neurological:     Mental Status: She is alert and oriented to person, place, and  time.  Psychiatric:        Mood and Affect: Mood normal.        Behavior: Behavior normal.        Thought Content: Thought content normal.        Judgment: Judgment normal.   Results for orders placed or performed during the hospital encounter of 05/06/21 (from the past 24 hour(s))  Pregnancy, urine POC     Status: Abnormal   Collection Time: 05/06/21  5:49 PM  Result Value Ref Range   Preg Test, Ur POSITIVE (A) NEGATIVE  Urinalysis, Routine w reflex microscopic Urine, Clean Catch     Status: Abnormal   Collection Time: 05/06/21  5:55 PM  Result Value Ref Range   Color, Urine YELLOW YELLOW   APPearance HAZY (A) CLEAR   Specific Gravity, Urine 1.013 1.005 - 1.030   pH 7.0 5.0 - 8.0   Glucose, UA NEGATIVE NEGATIVE mg/dL   Hgb urine dipstick MODERATE (A) NEGATIVE   Bilirubin Urine NEGATIVE NEGATIVE   Ketones, ur NEGATIVE NEGATIVE mg/dL   Protein, ur NEGATIVE NEGATIVE mg/dL   Nitrite NEGATIVE NEGATIVE   Leukocytes,Ua LARGE (A) NEGATIVE   RBC / HPF 0-5 0 - 5 RBC/hpf   WBC, UA 21-50 0 - 5 WBC/hpf   Bacteria, UA RARE (A) NONE SEEN   Squamous Epithelial / LPF 0-5 0 - 5   Mucus PRESENT   Wet prep, genital     Status: None   Collection Time: 05/06/21  6:39 PM   Specimen: Vaginal  Result Value Ref Range   Yeast Wet Prep HPF POC NONE SEEN NONE SEEN   Trich, Wet Prep NONE SEEN NONE SEEN   Clue Cells Wet Prep HPF POC NONE SEEN NONE SEEN   WBC, Wet Prep HPF POC <10 <10   Sperm NONE SEEN   ABO/Rh     Status: None   Collection Time: 05/06/21  7:06 PM  Result Value Ref Range   ABO/RH(D)      O POS Performed at [facility], [address], [place], [place] [address]    No results found.  MAU Course  Procedures  MDM -FHR 169 -UA: hazy/mod hgb/lg leuks/rare bacteria, sending urine for culture -WetPrep: WNL -GC/CT collected -ABO: O Positive -[place]: posterior previa, CL 3.77cm, fibroids x2 -pt discharged to home in stable condition  Orders Placed This Encounter  Procedures    Wet prep, genital    Standing Status:   Standing    Number of Occurrences:   1  Culture, OB Urine    Standing Status:   Standing    Number of Occurrences:   1   [place] MFM OB Transvaginal    Please perform cervical length.    Standing Status:   Standing    Number of Occurrences:   1    Order Specific Question:   Symptom/Reason for Exam    Answer:   Vaginal bleeding in pregnancy [705036]   Urinalysis, Routine w reflex microscopic Urine, Clean Catch    Standing Status:   Standing    Number of Occurrences:   1   Pregnancy, urine POC    Standing Status:   Standing    Number of Occurrences:   1   ABO/Rh    Standing Status:   Standing    Number of Occurrences:   1   No orders of the defined types were placed in this encounter.  Assessment and Plan   1. Vaginal bleeding in pregnancy   2. [redacted] weeks gestation of pregnancy   3. Leiomyoma of uterus affecting pregnancy in first trimester   4. Blood type, Rh positive     Allergies as of 05/06/2021       Reactions   Latex Itching   With gloves        Medication List     STOP taking these medications    ibuprofen 800 MG tablet Commonly known as: ADVIL       TAKE these medications    acetaminophen 325 MG tablet Commonly known as: TYLENOL Take 325 mg by mouth every 6 (six) hours as needed.   Arazlo 0.045 % Lotn Generic drug: Tazarotene Apply to acne daily   hydrocortisone 1 % lotion Apply 1 application topically daily.   multivitamin-prenatal 27-0.8 MG Tabs tablet Take 1 tablet by mouth daily at 12 noon.       -return MAU precautions given -pt discharged to home in stable condition  Gerrie Nordmann Kanna Dafoe 05/06/2021, 8:37 PM

## 2021-05-06 NOTE — MAU Note (Signed)
Started cramping and spotting this morning before she went to work. Pain Has gotten worse.  Was pinkish brown spotting, now just brown.  Saw OB on 1/16, had [place]- viable IUP.

## 2021-05-07 LAB — CULTURE, OB URINE: Culture: 90000 — AB

## 2021-05-08 LAB — GC/CHLAMYDIA PROBE AMP (~~LOC~~) NOT AT ARMC
Chlamydia: NEGATIVE
Comment: NEGATIVE
Comment: NORMAL
Neisseria Gonorrhea: NEGATIVE

## 2021-05-10 ENCOUNTER — Other Ambulatory Visit: Payer: Self-pay

## 2021-05-10 ENCOUNTER — Inpatient Hospital Stay (HOSPITAL_COMMUNITY)
Admission: AD | Admit: 2021-05-10 | Discharge: 2021-05-10 | Disposition: A | Discharge status: Home / Self Care | Payer: Medicaid Other | Attending: Obstetrics and Gynecology | Admitting: Obstetrics and Gynecology

## 2021-05-10 DIAGNOSIS — Z6790 Unspecified blood type, Rh positive: Secondary | ICD-10-CM | POA: Diagnosis not present

## 2021-05-10 DIAGNOSIS — O4401 Placenta previa specified as without hemorrhage, first trimester: Secondary | ICD-10-CM | POA: Diagnosis not present

## 2021-05-10 DIAGNOSIS — O209 Hemorrhage in early pregnancy, unspecified: Secondary | ICD-10-CM

## 2021-05-10 DIAGNOSIS — R1030 Lower abdominal pain, unspecified: Secondary | ICD-10-CM | POA: Insufficient documentation

## 2021-05-10 DIAGNOSIS — O26891 Other specified pregnancy related conditions, first trimester: Secondary | ICD-10-CM | POA: Diagnosis present

## 2021-05-10 DIAGNOSIS — O4411 Placenta previa with hemorrhage, first trimester: Secondary | ICD-10-CM | POA: Diagnosis not present

## 2021-05-10 DIAGNOSIS — Z3A13 13 weeks gestation of pregnancy: Secondary | ICD-10-CM | POA: Diagnosis not present

## 2021-05-10 LAB — URINALYSIS, ROUTINE W REFLEX MICROSCOPIC
Bilirubin Urine: NEGATIVE
Glucose, UA: NEGATIVE mg/dL
Ketones, ur: 15 mg/dL — AB
Nitrite: NEGATIVE
Protein, ur: NEGATIVE mg/dL
Specific Gravity, Urine: <1.005 — ABNORMAL LOW (ref 1.005–1.030)
pH: 6 (ref 5.0–8.0)

## 2021-05-10 LAB — URINALYSIS, MICROSCOPIC (REFLEX)

## 2021-05-10 NOTE — MAU Provider Note (Addendum)
Chief Complaint: Back Pain and Vaginal Bleeding   None      SUBJECTIVE HPI: Sandra Taylor is a 36 y.o. G1P0 at [redacted]w[redacted]d by LMP who presents to maternity admissions reporting bright red bleeding when wiping today. She reports one episode where red bleeding soaked the toilet paper when wiping, but additional trips to the restroom have had lighter and lighter pink spotting only. There is mild lower abdominal cramping associated with the pain.  She was dx with placenta previa on 12 week [place] and is concerned.    HPI  Past Medical History:  Diagnosis Date   Medical history non-contributory    Nonspecific reaction to tuberculin skin test without active tuberculosis(795.51) 05/26/2008   Qualifier: Diagnosis of  By: Drue Flirt  MD, Merrily Brittle     Past Surgical History:  Procedure Laterality Date   NO PAST SURGERIES     Social History   Socioeconomic History   Marital status: Single    Spouse name: Not on file   Number of children: Not on file   Years of education: Not on file   Highest education level: Not on file  Occupational History   Not on file  Tobacco Use   Smoking status: Never   Smokeless tobacco: Never  Vaping Use   Vaping Use: Never used  Substance and Sexual Activity   Alcohol use: No   Drug use: No   Sexual activity: Yes  Other Topics Concern   Not on file  Social History Narrative   Not on file   Social Determinants of Health   Financial Resource Strain: Not on file  Food Insecurity: Not on file  Transportation Needs: Not on file  Physical Activity: Not on file  Stress: Not on file  Social Connections: Not on file  Intimate Partner Violence: Not on file   No current facility-administered medications on file prior to encounter.   Current Outpatient Medications on File Prior to Encounter  Medication Sig Dispense Refill   acetaminophen (TYLENOL) 325 MG tablet Take 325 mg by mouth every 6 (six) hours as needed.     Prenatal Vit-Fe Fumarate-FA (MULTIVITAMIN-PRENATAL)  27-0.8 MG TABS tablet Take 1 tablet by mouth daily at 12 noon.     Allergies  Allergen Reactions   Latex Itching    With gloves    ROS:  Review of Systems  Constitutional:  Negative for chills, fatigue and fever.  Respiratory:  Negative for shortness of breath.   Cardiovascular:  Negative for chest pain.  Gastrointestinal:  Positive for abdominal pain.  Genitourinary:  Positive for pelvic pain and vaginal bleeding. Negative for difficulty urinating, dysuria, flank pain, vaginal discharge and vaginal pain.  Neurological:  Negative for dizziness and headaches.  Psychiatric/Behavioral: Negative.      I have reviewed patient's Past Medical Hx, Surgical Hx, Family Hx, Social Hx, medications and allergies.   Physical Exam   Vitals with BMI 05/10/2021 05/10/2021 05/10/2021  Height - - 5' 5.5"  Weight - - 155 lbs 13 oz  BMI - - 16.10  Systolic 960 454 -  Diastolic 78 61 -  Pulse 93 83 -    Constitutional: Well-developed, well-nourished female in no acute distress.  Cardiovascular: normal rate Respiratory: normal effort GI: Abd soft, non-tender. Pos BS x 4 MS: Extremities nontender, no edema, normal ROM Neurologic: Alert and oriented x 4.  GU: Neg CVAT.  PELVIC EXAM: Cervix pink, visually closed, without lesion, scant white creamy discharge, vaginal walls and external genitalia normal Bimanual exam:  Cervix 0/long/high, firm, anterior, neg CMT, uterus nontender, nonenlarged, adnexa without tenderness, enlargement, or mass  FHT 155 by doppler  LAB RESULTS No results found for this or any previous visit (from the past 24 hour(s)).  --/--/O POS Performed at [facility], [address] [place], [place] [address]  [phone]) Results for orders placed or performed during the hospital encounter of 05/10/21 (from the past 72 hour(s))  Urinalysis, Routine w reflex microscopic     Status: Abnormal   Collection Time: 05/10/21  6:12 PM  Result Value Ref Range   Color, Urine YELLOW  YELLOW   APPearance CLEAR CLEAR   Specific Gravity, Urine <1.005 (L) 1.005 - 1.030   pH 6.0 5.0 - 8.0   Glucose, UA NEGATIVE NEGATIVE mg/dL   Hgb urine dipstick MODERATE (A) NEGATIVE   Bilirubin Urine NEGATIVE NEGATIVE   Ketones, ur 15 (A) NEGATIVE mg/dL   Protein, ur NEGATIVE NEGATIVE mg/dL   Nitrite NEGATIVE NEGATIVE   Leukocytes,Ua LARGE (A) NEGATIVE    Comment: Performed at [facility] [address] [place], [place] [address]  Urinalysis, Microscopic (reflex)     Status: Abnormal   Collection Time: 05/10/21  6:12 PM  Result Value Ref Range   RBC / HPF 6-10 0 - 5 RBC/hpf   WBC, UA 21-50 0 - 5 WBC/hpf   Bacteria, UA MANY (A) NONE SEEN   Squamous Epithelial / LPF 6-10 0 - 5   Mucus PRESENT     Comment: Performed at [facility], [place] [address] [place], [place] [address]  Culture, [place] Urine     Status: Abnormal   Collection Time: 05/10/21  6:13 PM   Specimen: Urine, Random  Result Value Ref Range   Specimen Description URINE, RANDOM    Special Requests NONE    Culture (A)     MULTIPLE SPECIES PRESENT, SUGGEST RECOLLECTION NO GROUP B STREP (S.AGALACTIAE) ISOLATED Performed at [facility], [place] [address] [place], [place] [address]    Report Status 05/11/2021 FINAL     IMAGING [place] MFM OB Transvaginal  Result Date: 05/07/2021 ----------------------------------------------------------------------  OBSTETRICS REPORT                        (Signed Final 05/07/2021 10:25 am) ---------------------------------------------------------------------- Patient Info  ID #:       [id number]                          D.O.B.:  Jul 29, 1985 (35 yrs)  Name:       Sandra Taylor                    Visit Date: 05/06/2021 07:57 pm ---------------------------------------------------------------------- Performed By  Attending:        Tama High MD        Secondary Phy.:    [facility] MAU/Triage  Performed By:     Dorena Dew     [facility] [place]  Referred By:      Gerrie Nordmann NUGENT ----------------------------------------------------------------------  Orders  #  Description                           Code        Ordered By  1  [place] MFM OB TRANSVAGINAL                [id number]     NICOLE NUGENT ----------------------------------------------------------------------  #  Order #                     Accession #                Episode #  1  [id number]                   [id number]                 [id number] ---------------------------------------------------------------------- Indications  Vaginal bleeding in pregnancy, first trimester  O46.91  [redacted] weeks gestation of pregnancy                 Z3A.12  Encounter for cervical length                   Z36.86  Uterine fibroids                                O34.10  Pelvic pain affecting pregnancy, first          O26.891  trimester ---------------------------------------------------------------------- Fetal Evaluation  Num Of Fetuses:          1  Fetal Heart Rate(bpm):   166  Cardiac Activity:        Observed  Presentation:            Breech  Placenta:                Posterior Previa  P. Cord Insertion:       Not well visualized  Amniotic Fluid  AFI FV:      Within normal limits ---------------------------------------------------------------------- Biometry  CRL:      67.6  mm     G. Age:  12w 6d                  EDD:    11/12/21 ---------------------------------------------------------------------- OB History  Gravidity:    1         Term:   0        Prem:   0        SAB:   0  TOP:          0       Ectopic:  0        Living: 0 ---------------------------------------------------------------------- Gestational Age  Best:          12w 6d     Det. By:  Previous Ultrasound      EDD:   11/12/21 ---------------------------------------------------------------------- Cervix Uterus Adnexa  Cervix  Length:           3.77  cm.  Normal appearance by transvaginal scan  Right Ovary  Small corpus  luteum noted.  Left Ovary  Not visualized.  Adnexa  No abnormality visualized. ---------------------------------------------------------------------- Myomas  Site                     L(cm)      W(cm)       D(cm)  Location  Right LUS                3.4        3.4         2.9        Intramural  Posterior                2.8        2.8         2.9        Intramural ----------------------------------------------------------------------  Blood Flow                  RI       PI       Comments ---------------------------------------------------------------------- Impression  G1 P0. Patient was evaluated at the MAU for c/o vaginal  bleeding.  Transvaginal ultrasound was performed to evaluate the  pregnancy and cervix. The Betances measurement is consistent  with her previously-established dates and good fetal heart  activity is seen.  The cervix measures 3.8 cm, which is normal. Posterior  placenta previa is seen.  Two intramural myomas are seen (measurements above). ----------------------------------------------------------------------                  Tama High, MD Electronically Signed Final Report   05/07/2021 10:25 am ----------------------------------------------------------------------   MAU Management/MDM: Orders Placed This Encounter  Procedures   Culture, OB Urine   Urinalysis, Routine w reflex microscopic   Urinalysis, Microscopic (reflex)   Discharge patient    No orders of the defined types were placed in this encounter.   Pt with minimal bleeding, normal fetal heart tones by doppler.  Discussed previous US with previa.  No treatments at this time, pelvic rest, and pt to continue prenatal care as scheduled. Blood type O positive.  Bleeding precautions/reasons to return to MAU reviewed.     ASSESSMENT 1. Vaginal bleeding in pregnancy, first trimester   2. [redacted] weeks gestation of pregnancy   3. Placenta previa antepartum in first trimester   4. Blood type, Rh positive     PLAN Discharge  home Allergies as of 05/10/2021       Reactions   Latex Itching   With gloves        Medication List     STOP taking these medications    Arazlo 0.045 % Lotn Generic drug: Tazarotene   hydrocortisone 1 % lotion       TAKE these medications    acetaminophen 325 MG tablet Commonly known as: TYLENOL Take 325 mg by mouth every 6 (six) hours as needed.   multivitamin-prenatal 27-0.8 MG Tabs tablet Take 1 tablet by mouth daily at 12 noon.        Follow-up Information     Ob/Gyn, [place] Follow up.   Specialty: Obstetrics and Gynecology Why: As scheduled Contact information: [address] [place] [address] [phone]         [facility] Follow up.   Specialty: Obstetrics and Gynecology Why: As needed for emergencies Contact information: [address] [id number] [place] Certified Nurse-Midwife 05/13/2021  4:27 AM

## 2021-05-10 NOTE — MAU Note (Signed)
...  Sandra Taylor is a 36 y.o. at [redacted]w[redacted]d here in MAU reporting: Lower back pain that started while at work this morning. She states around 1400 she noticed bright red vaginal bleeding when she used the restroom that was in the toilet and on her tissue. She states she took 500 mg of Tylenol in the MAU lobby around 1600. She states her lower back pain is now 2/10 from a previous 10/10. She did not see any blood in the restroom in MAU.   Pain score:  2/10 lower back  Vitals:   05/10/21 1659  BP: 123/61  Pulse: 83  Resp: 17  Temp: 98.3 F (36.8 C)     FHT: 155 doppler

## 2021-05-11 LAB — CULTURE, OB URINE

## 2021-05-18 ENCOUNTER — Encounter: Payer: Self-pay | Admitting: Obstetrics and Gynecology

## 2021-05-18 ENCOUNTER — Other Ambulatory Visit: Payer: Self-pay

## 2021-05-18 ENCOUNTER — Ambulatory Visit (INDEPENDENT_AMBULATORY_CARE_PROVIDER_SITE_OTHER): Payer: Medicaid Other | Admitting: Obstetrics and Gynecology

## 2021-05-18 DIAGNOSIS — Z3A14 14 weeks gestation of pregnancy: Secondary | ICD-10-CM

## 2021-05-18 DIAGNOSIS — O4402 Placenta previa specified as without hemorrhage, second trimester: Secondary | ICD-10-CM

## 2021-05-18 DIAGNOSIS — O0990 Supervision of high risk pregnancy, unspecified, unspecified trimester: Secondary | ICD-10-CM | POA: Insufficient documentation

## 2021-05-18 DIAGNOSIS — Z3482 Encounter for supervision of other normal pregnancy, second trimester: Secondary | ICD-10-CM | POA: Diagnosis not present

## 2021-05-18 DIAGNOSIS — O4400 Placenta previa specified as without hemorrhage, unspecified trimester: Secondary | ICD-10-CM | POA: Insufficient documentation

## 2021-05-18 MED ORDER — ASPIRIN EC 81 MG PO TBEC: 81.0000 mg | DELAYED_RELEASE_TABLET | Freq: Every day | ORAL | 11 refills | Status: DC

## 2021-05-18 NOTE — Progress Notes (Signed)
History:   Sandra Taylor is a 36 y.o. G1P0 at [redacted]w[redacted]d by L=12 being seen today for her first obstetrical visit.  Her obstetrical history is none due to G1. Patient does intend to breast feed.   Patient reports no complaints. She had some bleeding in the first trimester - evaluation done in MAU negative except possible previa on 12 wk [place]. Her bleeding has stopped.   She has a h/o H.pylori which was symptomatic while traveling to [place] but it has since improved. She did not take the treatment for it.   She has had intermittent HA but has also reduced her caffeine. She has taken 250 mg of tylenol.       HISTORY: OB History  Gravida Para Term Preterm AB Living  1 0 0 0 0 0  SAB IAB Ectopic Multiple Live Births  0 0 0 0 0    # Outcome Date GA Lbr Len/2nd Weight Sex Delivery Anes PTL Lv  1 Current             Last pap smear was done 2014 and was normal  Past Medical History:  Diagnosis Date   Medical history non-contributory    Nonspecific reaction to tuberculin skin test without active tuberculosis(795.51) 05/26/2008   Qualifier: Diagnosis of  By: Drue Flirt  MD, Merrily Brittle     Past Surgical History:  Procedure Laterality Date   NO PAST SURGERIES     Family History  Problem Relation Age of Onset   Diabetes Mother    Hypertension Mother    Diabetes Father        borderline   Social History   Tobacco Use   Smoking status: Never   Smokeless tobacco: Never  Vaping Use   Vaping Use: Never used  Substance Use Topics   Alcohol use: No   Drug use: No   Allergies  Allergen Reactions   Latex Itching    With gloves   Current Outpatient Medications on File Prior to Visit  Medication Sig Dispense Refill   acetaminophen (TYLENOL) 325 MG tablet Take 325 mg by mouth every 6 (six) hours as needed.     Prenatal Vit-Fe Fumarate-FA (MULTIVITAMIN-PRENATAL) 27-0.8 MG TABS tablet Take 1 tablet by mouth daily at 12 noon.     No current facility-administered medications on file  prior to visit.    Review of Systems Pertinent items noted in HPI and remainder of comprehensive ROS otherwise negative.  Physical Exam:   Vitals:   05/18/21 1610  BP: 114/77  Pulse: (!) 102  Weight: 155 lb 3.2 oz (70.4 kg)   Fetal Heart Rate (bpm): 148   General: well-developed, well-nourished female in no acute distress     Skin: normal coloration and turgor, no rashes  Neurologic: oriented, normal, negative, normal mood  Extremities: normal strength, tone, and muscle mass, ROM of all joints is normal  HEENT PERRLA, extraocular movement intact and sclera clear, anicteric  Neck supple and no masses  Cardiovascular: regular rate and rhythm  Respiratory:  no respiratory distress, normal breath sounds  Abdomen: soft, non-tender; bowel sounds normal; no masses,  no organomegaly       Assessment:    Pregnancy: G1P0 Patient Active Problem List   Diagnosis Date Noted   Supervision of high risk pregnancy, antepartum 05/18/2021   Placenta previa 05/18/2021     Plan:    1. Encounter for supervision of normal first pregnancy in second trimester - G1 and AMA - ldASA recommended and  rx sent for PreE risk reduction - She had flu shot, pap w/HPV at [place] - She had normal genetics as well. - Reviewed OTC options for HA - mag/b12, benadryl, caffiene, hydration. Can also use tylenol. Reviewed correlations with tylenol and limits to those studies.    Initial labs drawn. Continue prenatal vitamins. Problem list reviewed and updated. Ultrasound discussed; fetal anatomic survey: ordered. Anticipatory guidance about prenatal visits given including labs, ultrasounds, and testing. Discussed usage of Babyscripts and virtual visits as additional source of managing and completing prenatal visits  Encouraged to complete MyChart Registration for her ability to review results, send requests, and have questions addressed.  The nature of [place] for [facility]  Healthcare/Faculty Practice with multiple MDs and Advanced Practice Providers was explained to patient; also emphasized that residents, students are part of our team. Routine obstetric precautions reviewed. Encouraged to seek out care at office or emergency room [facility] MAU preferred) for urgent and/or emergent concerns. Return in about 4 weeks (around 06/15/2021).    Radene Gunning, MD, [place] for [facility], [place]

## 2021-05-18 NOTE — Progress Notes (Signed)
Pt presents for NOB.  PN labs and pap completed at [place] per pt PHQ9=0 GAD7=0

## 2021-06-15 ENCOUNTER — Encounter: Payer: Medicaid Other | Admitting: Family Medicine

## 2021-06-21 ENCOUNTER — Ambulatory Visit: Payer: Medicaid Other | Admitting: *Deleted

## 2021-06-21 ENCOUNTER — Encounter: Payer: Self-pay | Admitting: *Deleted

## 2021-06-21 ENCOUNTER — Ambulatory Visit (INDEPENDENT_AMBULATORY_CARE_PROVIDER_SITE_OTHER): Payer: Medicaid Other

## 2021-06-21 ENCOUNTER — Other Ambulatory Visit: Payer: Self-pay

## 2021-06-21 ENCOUNTER — Other Ambulatory Visit: Payer: Self-pay | Admitting: *Deleted

## 2021-06-21 ENCOUNTER — Ambulatory Visit: Payer: Medicaid Other | Attending: Obstetrics and Gynecology

## 2021-06-21 VITALS — BP 116/69 | HR 94 | Wt 163.0 lb

## 2021-06-21 VITALS — BP 126/68 | HR 98

## 2021-06-21 DIAGNOSIS — O0990 Supervision of high risk pregnancy, unspecified, unspecified trimester: Secondary | ICD-10-CM | POA: Diagnosis not present

## 2021-06-21 DIAGNOSIS — Z3689 Encounter for other specified antenatal screening: Secondary | ICD-10-CM

## 2021-06-21 DIAGNOSIS — Z3A19 19 weeks gestation of pregnancy: Secondary | ICD-10-CM

## 2021-06-21 DIAGNOSIS — D259 Leiomyoma of uterus, unspecified: Secondary | ICD-10-CM

## 2021-06-21 DIAGNOSIS — O09522 Supervision of elderly multigravida, second trimester: Secondary | ICD-10-CM

## 2021-06-21 DIAGNOSIS — R12 Heartburn: Secondary | ICD-10-CM

## 2021-06-21 DIAGNOSIS — O4440 Low lying placenta NOS or without hemorrhage, unspecified trimester: Secondary | ICD-10-CM

## 2021-06-21 DIAGNOSIS — O26892 Other specified pregnancy related conditions, second trimester: Secondary | ICD-10-CM

## 2021-06-21 DIAGNOSIS — O4442 Low lying placenta NOS or without hemorrhage, second trimester: Secondary | ICD-10-CM

## 2021-06-21 DIAGNOSIS — D219 Benign neoplasm of connective and other soft tissue, unspecified: Secondary | ICD-10-CM

## 2021-06-21 DIAGNOSIS — O3412 Maternal care for benign tumor of corpus uteri, second trimester: Secondary | ICD-10-CM

## 2021-06-21 MED ORDER — FAMOTIDINE 20 MG PO TABS: 20.0000 mg | ORAL_TABLET | Freq: Two times a day (BID) | ORAL | 3 refills | Status: DC

## 2021-06-21 NOTE — Progress Notes (Signed)
? ?  PRENATAL VISIT NOTE ? ?Subjective:  ?Sandra Taylor is a 36 y.o. G1P0 at 87w3dbeing seen today for ongoing prenatal care.  She is currently monitored for the following issues for this high-risk pregnancy and has Supervision of high risk pregnancy, antepartum and Placenta previa on their problem list. ? ?Patient reports no complaints.  Contractions: Not present. Vag. Bleeding: None.  Movement: Present. Denies leaking of fluid.  ? ?The following portions of the patient's history were reviewed and updated as appropriate: allergies, current medications, past family history, past medical history, past social history, past surgical history and problem list.  ? ?Objective:  ? ?Vitals:  ? 06/21/21 1350  ?BP: 116/69  ?Pulse: 94  ?Weight: 163 lb (73.9 kg)  ? ? ?Fetal Status: Fetal Heart Rate (bpm): 147   Movement: Present    ? ?General:  Alert, oriented and cooperative. Patient is in no acute distress.  ?Skin: Skin is warm and dry. No rash noted.   ?Cardiovascular: Normal heart rate noted  ?Respiratory: Normal respiratory effort, no problems with respiration noted  ?Abdomen: Soft, gravid, appropriate for gestational age.  Pain/Pressure: Absent     ?Pelvic: Cervical exam deferred        ?Extremities: Normal range of motion.  Edema: None  ?Mental Status: Normal mood and affect. Normal behavior. Normal judgment and thought content.  ? ?Assessment and Plan:  ?Pregnancy: G1P0 at 123w3d1. Supervision of high risk pregnancy, antepartum ?- Routine OB. Doing well, no concerns ?- Anticipatory guidance for upcoming appointments provided ? ?- AFP, Serum, Open Spina Bifida ? ?2. [redacted] weeks gestation of pregnancy ? ? ?3. Low-lying placenta ?- Placenta 1.1cm from os ?- No bleeding ?- F/u ultrasound scheduled  ? ?4. Leiomyoma of uterus affecting pregnancy in second trimester ?- 2 small fibroids ?- See above ? ?5. Heartburn during pregnancy in second trimester ?- Intermittent heartburn, mostly controlled by diet ? ?- famotidine (PEPCID) 20  MG tablet; Take 1 tablet (20 mg total) by mouth 2 (two) times daily.  Dispense: 60 tablet; Refill: 3 ? ? ?Preterm labor symptoms and general obstetric precautions including but not limited to vaginal bleeding, contractions, leaking of fluid and fetal movement were reviewed in detail with the patient. ?Please refer to After Visit Summary for other counseling recommendations.  ? ?Return in about 4 weeks (around 07/19/2021). ? ?Future Appointments  ?Date Time Provider [place]?07/31/2021  1:30 PM DuRadene GunningMD [facility]  ?08/16/2021 11:15 AM [facility] NURSE [facility]  ?08/16/2021 11:30 AM [facility] US3 [facility]  ? ? ?DaRenee HarderCNM ? ?

## 2021-06-21 NOTE — Patient Instructions (Signed)

## 2021-06-22 ENCOUNTER — Encounter: Payer: Medicaid Other | Admitting: Family Medicine

## 2021-06-23 LAB — AFP, SERUM, OPEN SPINA BIFIDA
AFP MoM: 0.91
AFP Value: 50.3 ng/mL
Gest. Age on Collection Date: 19.3 weeks
Maternal Age At EDD: 36.9 yr
OSBR Risk 1 IN: 10000
Test Results:: NEGATIVE
Weight: 163 [lb_av]

## 2021-06-27 ENCOUNTER — Telehealth: Payer: Self-pay

## 2021-06-30 ENCOUNTER — Encounter: Payer: Self-pay | Admitting: Obstetrics and Gynecology

## 2021-07-05 ENCOUNTER — Encounter: Payer: Self-pay | Admitting: Obstetrics and Gynecology

## 2021-07-05 ENCOUNTER — Encounter: Payer: Self-pay | Admitting: Emergency Medicine

## 2021-07-05 ENCOUNTER — Telehealth: Payer: Self-pay | Admitting: Emergency Medicine

## 2021-07-05 NOTE — Telephone Encounter (Signed)
Attempted call to pt to f/u about voicemail. Pt reports nausea, vomiting and heartburn. Left message to return call to clinic. ?Sent MyChart message with recommendations for management of symptoms. ?

## 2021-07-06 ENCOUNTER — Encounter: Payer: Self-pay | Admitting: Obstetrics and Gynecology

## 2021-07-11 ENCOUNTER — Telehealth: Payer: Self-pay | Admitting: Emergency Medicine

## 2021-07-11 NOTE — Telephone Encounter (Signed)
RC to patient who called after hours nurse.  ?Pt reports worsening heartburn with reflux and 1 episode of vomiting. Pt states that she has been using pepcid and has relief today.  ?Pt counseled on reflux precautions and advised to continue pepcid and try tums for PRN relief of symptoms. ?

## 2021-07-19 ENCOUNTER — Ambulatory Visit (INDEPENDENT_AMBULATORY_CARE_PROVIDER_SITE_OTHER): Payer: Medicaid Other | Admitting: Certified Nurse Midwife

## 2021-07-19 ENCOUNTER — Encounter: Payer: Medicaid Other | Admitting: Certified Nurse Midwife

## 2021-07-19 VITALS — BP 119/72 | HR 88 | Wt 165.3 lb

## 2021-07-19 DIAGNOSIS — Z3A23 23 weeks gestation of pregnancy: Secondary | ICD-10-CM

## 2021-07-19 DIAGNOSIS — O4440 Low lying placenta NOS or without hemorrhage, unspecified trimester: Secondary | ICD-10-CM

## 2021-07-19 DIAGNOSIS — Z3492 Encounter for supervision of normal pregnancy, unspecified, second trimester: Secondary | ICD-10-CM

## 2021-07-19 NOTE — Progress Notes (Signed)
? ?  PRENATAL VISIT NOTE ? ?Subjective:  ?Sandra Taylor is a 36 y.o. G1P0 at 17w3dbeing seen today for ongoing prenatal care.  She is currently monitored for the following issues for this low-risk pregnancy and has Supervision of high risk pregnancy, antepartum and Placenta previa on their problem list. ? ?Patient reports no complaints.  Contractions: Not present. Vag. Bleeding: None.  Movement: Present. Denies leaking of fluid.  ? ?The following portions of the patient's history were reviewed and updated as appropriate: allergies, current medications, past family history, past medical history, past social history, past surgical history and problem list.  ? ?Objective:  ? ?Vitals:  ? 07/19/21 1340  ?BP: 119/72  ?Pulse: 88  ?Weight: 165 lb 4.8 oz (75 kg)  ? ? ?Fetal Status: Fetal Heart Rate (bpm): 155   Movement: Present    ? ?General:  Alert, oriented and cooperative. Patient is in no acute distress.  ?Skin: Skin is warm and dry. No rash noted.   ?Cardiovascular: Normal heart rate noted  ?Respiratory: Normal respiratory effort, no problems with respiration noted  ?Abdomen: Soft, gravid, appropriate for gestational age.  Pain/Pressure: Present     ?Pelvic: Cervical exam deferred        ?Extremities: Normal range of motion.     ?Mental Status: Normal mood and affect. Normal behavior. Normal judgment and thought content.  ? ?Assessment and Plan:  ?Pregnancy: G1P0 at 21w3d1. Supervision of low-risk pregnancy, second trimester ?- Doing well, feeling regular and vigorous fetal movement  ? ?2. [redacted] weeks gestation of pregnancy ?- Routine OB care including anticipatory guidance re GTT at next visit ? ?3. Low-lying placenta ?- Next ultrasound to evaluate scheduled for 08/16/21 ? ?Preterm labor symptoms and general obstetric precautions including but not limited to vaginal bleeding, contractions, leaking of fluid and fetal movement were reviewed in detail with the patient. ?Please refer to After Visit Summary for other  counseling recommendations.  ? ?Return for IN-PERSON, LOB/GTT. ? ?Future Appointments  ?Date Time Provider [place]?08/16/2021  8:15 AM [facility] None  ?08/16/2021 10:35 AM FoJohnston EbbsNP [facility] None  ?08/16/2021 11:15 AM [facility] NURSE [facility]  ?08/16/2021 11:30 AM [facility] US3 [facility]  ? ? ?JaGabriel CarinaCNM ?

## 2021-07-31 ENCOUNTER — Encounter: Payer: Medicaid Other | Admitting: Obstetrics and Gynecology

## 2021-08-16 ENCOUNTER — Ambulatory Visit (HOSPITAL_BASED_OUTPATIENT_CLINIC_OR_DEPARTMENT_OTHER): Payer: Medicaid Other

## 2021-08-16 ENCOUNTER — Ambulatory Visit: Payer: Medicaid Other | Attending: Obstetrics | Admitting: *Deleted

## 2021-08-16 ENCOUNTER — Ambulatory Visit (INDEPENDENT_AMBULATORY_CARE_PROVIDER_SITE_OTHER): Payer: Medicaid Other | Admitting: Student

## 2021-08-16 ENCOUNTER — Other Ambulatory Visit: Payer: Self-pay | Admitting: Obstetrics and Gynecology

## 2021-08-16 ENCOUNTER — Other Ambulatory Visit: Payer: Medicaid Other

## 2021-08-16 ENCOUNTER — Other Ambulatory Visit: Payer: Self-pay | Admitting: *Deleted

## 2021-08-16 VITALS — BP 121/73 | HR 92 | Wt 168.2 lb

## 2021-08-16 VITALS — BP 103/58 | HR 93

## 2021-08-16 DIAGNOSIS — O09512 Supervision of elderly primigravida, second trimester: Secondary | ICD-10-CM | POA: Diagnosis not present

## 2021-08-16 DIAGNOSIS — D219 Benign neoplasm of connective and other soft tissue, unspecified: Secondary | ICD-10-CM

## 2021-08-16 DIAGNOSIS — Z3A27 27 weeks gestation of pregnancy: Secondary | ICD-10-CM | POA: Diagnosis not present

## 2021-08-16 DIAGNOSIS — O3412 Maternal care for benign tumor of corpus uteri, second trimester: Secondary | ICD-10-CM | POA: Diagnosis not present

## 2021-08-16 DIAGNOSIS — O4440 Low lying placenta NOS or without hemorrhage, unspecified trimester: Secondary | ICD-10-CM

## 2021-08-16 DIAGNOSIS — O09522 Supervision of elderly multigravida, second trimester: Secondary | ICD-10-CM

## 2021-08-16 DIAGNOSIS — O4442 Low lying placenta NOS or without hemorrhage, second trimester: Secondary | ICD-10-CM

## 2021-08-16 DIAGNOSIS — Z3402 Encounter for supervision of normal first pregnancy, second trimester: Secondary | ICD-10-CM | POA: Diagnosis not present

## 2021-08-16 DIAGNOSIS — Z23 Encounter for immunization: Secondary | ICD-10-CM | POA: Diagnosis not present

## 2021-08-16 DIAGNOSIS — Z3689 Encounter for other specified antenatal screening: Secondary | ICD-10-CM

## 2021-08-16 DIAGNOSIS — O26892 Other specified pregnancy related conditions, second trimester: Secondary | ICD-10-CM

## 2021-08-16 DIAGNOSIS — R300 Dysuria: Secondary | ICD-10-CM

## 2021-08-16 NOTE — Progress Notes (Signed)
? ?  PRENATAL VISIT NOTE ? ?Subjective:  ?Sandra Taylor is a 36 y.o. G1P0 at 15w3dbeing seen today for ongoing prenatal care.  She is currently monitored for the following issues for this low-risk pregnancy and has Supervision of high risk pregnancy, antepartum and Placenta previa on their problem list. ? ?Patient reports no complaints.  Contractions: Not present. Vag. Bleeding: None.  Movement: Present. Denies leaking of fluid. Mild pressure when urinating. Occurs intermittently. 4/10 pressure at the start of urinating. Resolves once stream is started and bladder is emptied. ? ?The following portions of the patient's history were reviewed and updated as appropriate: allergies, current medications, past family history, past medical history, past social history, past surgical history and problem list.  ? ?Objective:  ? ?Vitals:  ? 08/16/21 0859  ?BP: 121/73  ?Pulse: 92  ?Weight: 168 lb 3.2 oz (76.3 kg)  ? ? ?Fetal Status: Fetal Heart Rate (bpm): 155 Fundal Height: 25 cm Movement: Present    ? ?General:  Alert, oriented and cooperative. Patient is in no acute distress.  ?Skin: Skin is warm and dry. No rash noted.   ?Cardiovascular: Normal heart rate noted  ?Respiratory: Normal respiratory effort, no problems with respiration noted  ?Abdomen: Soft, gravid, appropriate for gestational age.  Pain/Pressure: Present     ?Pelvic: Cervical exam deferred        ?Extremities: Normal range of motion.  Edema: None  ?Mental Status: Normal mood and affect. Normal behavior. Normal judgment and thought content.  ? ?Assessment and Plan:  ?Pregnancy: G1P0 at 221w3d1. Supervision of low risk pregnancy, second trimester ?- Doing well, feeling baby move frequently  ?-Follow-up [place] later today on 5/10 for evaluation of low-lying placenta ?- HIV Antibody (routine testing w rflx) ?- RPR ?- CBC ?- Glucose Tolerance, 2 Hours w/1 Hour ?- Tdap vaccine greater than or equal to 7yo IM ? ?2. [redacted] weeks gestation of pregnancy ?-Routine OB  care ? ?3. Dysuria during pregnancy in second trimester ?- Culture, OB Urine collected ?-discussed increased hydration and avoiding bladder irritants. Discussed normal symptoms in pregnancy  ? ?4. Low-lying placenta ?-follow-up ultrasound today ?-no pain, bleeding or early contractions reported ? ?Preterm labor symptoms and general obstetric precautions including but not limited to vaginal bleeding, contractions, leaking of fluid and fetal movement were reviewed in detail with the patient. ?Please refer to After Visit Summary for other counseling recommendations.  ? ?Return in about 2 weeks (around 08/30/2021) for LOB, IN-PERSON. ? ?Future Appointments  ?Date Time Provider [place]?08/16/2021 11:15 AM [facility] NURSE [facility]  ?08/16/2021 11:30 AM [facility] US3 [facility]  ?09/12/2021  4:10 PM Leftwich-Kirby, LiKathie DikeCNM [facility] None  ?09/26/2021  8:35 AM Leftwich-Kirby, LiKathie DikeCNM [facility] None  ?10/11/2021  8:35 AM Leftwich-Kirby, LiKathie DikeCNM [facility] None  ? ? ?NiJohnston EbbsNP ? ?

## 2021-08-16 NOTE — Progress Notes (Signed)
Patient presents for ROB. ?28 week labs drawn, Tdap given. ?No unusual complaints today.  ? ?

## 2021-08-17 LAB — CBC
Hematocrit: 35.7 % (ref 34.0–46.6)
Hemoglobin: 12.2 g/dL (ref 11.1–15.9)
MCH: 32.8 pg (ref 26.6–33.0)
MCHC: 34.2 g/dL (ref 31.5–35.7)
MCV: 96 fL (ref 79–97)
Platelets: 178 10*3/uL (ref 150–450)
RBC: 3.72 x10E6/uL — ABNORMAL LOW (ref 3.77–5.28)
RDW: 12.7 % (ref 11.7–15.4)
WBC: 5.2 10*3/uL (ref 3.4–10.8)

## 2021-08-17 LAB — GLUCOSE TOLERANCE, 2 HOURS W/ 1HR
Glucose, 1 hour: 97 mg/dL (ref 70–179)
Glucose, 2 hour: 77 mg/dL (ref 70–152)
Glucose, Fasting: 82 mg/dL (ref 70–91)

## 2021-08-17 LAB — RPR: RPR Ser Ql: NONREACTIVE

## 2021-08-17 LAB — HIV ANTIBODY (ROUTINE TESTING W REFLEX): HIV Screen 4th Generation wRfx: NONREACTIVE

## 2021-08-17 NOTE — Telephone Encounter (Signed)
error 

## 2021-08-18 LAB — CULTURE, OB URINE

## 2021-08-18 LAB — URINE CULTURE, OB REFLEX

## 2021-09-12 ENCOUNTER — Ambulatory Visit (INDEPENDENT_AMBULATORY_CARE_PROVIDER_SITE_OTHER): Payer: Medicaid Other | Admitting: Advanced Practice Midwife

## 2021-09-12 VITALS — BP 113/70 | HR 91 | Wt 173.0 lb

## 2021-09-12 DIAGNOSIS — O4440 Low lying placenta NOS or without hemorrhage, unspecified trimester: Secondary | ICD-10-CM

## 2021-09-12 DIAGNOSIS — Z3402 Encounter for supervision of normal first pregnancy, second trimester: Secondary | ICD-10-CM

## 2021-09-12 DIAGNOSIS — Z3A31 31 weeks gestation of pregnancy: Secondary | ICD-10-CM

## 2021-09-12 NOTE — Progress Notes (Signed)
   PRENATAL VISIT NOTE  Subjective:  Sandra Taylor is a 36 y.o. G1P0 at 51w2dbeing seen today for ongoing prenatal care.  She is currently monitored for the following issues for this low-risk pregnancy and has Supervision of high risk pregnancy, antepartum and Placenta previa on their problem list.  Patient reports no complaints.  Contractions: Not present. Vag. Bleeding: None.  Movement: Present. Denies leaking of fluid.   The following portions of the patient's history were reviewed and updated as appropriate: allergies, current medications, past family history, past medical history, past social history, past surgical history and problem list.   Objective:   Vitals:   09/12/21 1619  BP: 113/70  Pulse: 91  Weight: 173 lb (78.5 kg)    Fetal Status: Fetal Heart Rate (bpm): 148   Movement: Present     General:  Alert, oriented and cooperative. Patient is in no acute distress.  Skin: Skin is warm and dry. No rash noted.   Cardiovascular: Normal heart rate noted  Respiratory: Normal respiratory effort, no problems with respiration noted  Abdomen: Soft, gravid, appropriate for gestational age.  Pain/Pressure: Present     Pelvic: Cervical exam deferred        Extremities: Normal range of motion.  Edema: Trace  Mental Status: Normal mood and affect. Normal behavior. Normal judgment and thought content.   Assessment and Plan:  Pregnancy: G1P0 at 333w2d. Supervision of low-risk first pregnancy, second trimester --Anticipatory guidance about next visits/weeks of pregnancy given.  --Desires natural labor, discussed options for pain management including hydrotherapy/waterbirth, position changes, use of birth ball, doulas, and pain medication options.  --Encouraged pt to enroll in childbirth classes, watebirth class if considering waterbirth  2. [redacted] weeks gestation of pregnancy   3. Low-lying placenta --1.6 cm from os on 08/16/21, follow up [place] MFM in 1 week --No bleeding or cramping    Preterm labor symptoms and general obstetric precautions including but not limited to vaginal bleeding, contractions, leaking of fluid and fetal movement were reviewed in detail with the patient. Please refer to After Visit Summary for other counseling recommendations.   Return for As scheduled, Midwife preferred, LOB.  Future Appointments  Date Time Provider [place]6/14/2023  1:30 PM [facility]  1:45 PM [facility]  8:35 AM Leftwich-Kirby, LiKathie DikeCNM [facility] None  10/11/2021  8:35 AM Leftwich-Kirby, LiKathie DikeCNM [facility] None  10/16/2021  8:35 AM Leftwich-Kirby, LiKathie DikeCNM [facility] None  10/24/2021  8:35 AM Leftwich-Kirby, LiKathie DikeCNM [facility] None  10/30/2021  8:35 AM Leftwich-Kirby, LiKathie DikeCNM [facility] None    LiFatima BlankCNM

## 2021-09-12 NOTE — Patient Instructions (Signed)
  Considering Waterbirth? Guide for patients at [facility] [place]) Why consider waterbirth? Gentle birth for babies  Less pain medicine used in labor  May allow for passive descent/less pushing  May reduce perineal tears  More mobility and instinctive maternal position changes  Increased maternal relaxation   Is waterbirth safe? What are the risks of infection, drowning or other complications? Infection:  Very low risk (3.7 % for tub vs 4.8% for bed)  7 in 8000 waterbirths with documented infection  Poorly cleaned equipment most common cause  Slightly lower group B strep transmission rate  Drowning  Maternal:  Very low risk  Related to seizures or fainting  Newborn:  Very low risk. No evidence of increased risk of respiratory problems in multiple large studies  Physiological protection from breathing under water  Avoid underwater birth if there are any fetal complications  Once baby's head is out of the water, keep it out.  Birth complication  Some reports of cord trauma, but risk decreased by bringing baby to surface gradually  No evidence of increased risk of shoulder dystocia. Mothers can usually change positions faster in water than in a bed, possibly aiding the maneuvers to free the shoulder.   There are 2 things you MUST do to have a waterbirth with [facility]: Attend a waterbirth class at [place] at [facility]   3rd Wednesday of every month from 7-9 pm (virtual during [place]) [employer] at [email or web address] or [email or web address] or by calling [phone] Bring [place] the certificate from the class to your prenatal appointment or send via [place] with a midwife at 36 weeks* to see if you can still plan a waterbirth and to sign the consent.   *We also recommend that you schedule as many of your prenatal visits with a midwife as possible.    Helpful information: You may want to bring a bathing suit top to the hospital  to wear during labor but this is optional.  All other supplies are provided by the hospital. Please arrive at the hospital with signs of active labor, and do not wait at home until late in labor. It takes 45 min- 1 hour for fetal monitoring, and check in to your room to take place, plus transport and filling of the waterbirth tub.    Things that would prevent you from having a waterbirth: Premature, <37wks  Previous cesarean birth  Presence of thick meconium-stained fluid  Multiple gestation (Twins, triplets, etc.)  Uncontrolled diabetes or gestational diabetes requiring medication  Hypertension diagnosed in pregnancy or preexisting hypertension (gestational hypertension, preeclampsia, or chronic hypertension) Fetal growth restriction (your baby measures less than 10th percentile on ultrasound) Heavy vaginal bleeding  Non-reassuring fetal heart rate  Active infection (MRSA, etc.). Group B Strep is NOT a contraindication for waterbirth.  If your labor has to be induced and induction method requires continuous monitoring of the baby's heart rate  Other risks/issues identified by your obstetrical provider   Please remember that birth is unpredictable. Under certain unforeseeable circumstances your provider may advise against giving birth in the tub. These decisions will be made on a case-by-case basis and with the safety of you and your baby as our highest priority.    Updated 07/12/21

## 2021-09-19 ENCOUNTER — Ambulatory Visit: Payer: Medicaid Other

## 2021-09-20 ENCOUNTER — Ambulatory Visit (HOSPITAL_BASED_OUTPATIENT_CLINIC_OR_DEPARTMENT_OTHER): Payer: Medicaid Other

## 2021-09-20 ENCOUNTER — Ambulatory Visit: Payer: Medicaid Other | Attending: Maternal & Fetal Medicine | Admitting: *Deleted

## 2021-09-20 VITALS — BP 111/59 | HR 85

## 2021-09-20 DIAGNOSIS — Z3A32 32 weeks gestation of pregnancy: Secondary | ICD-10-CM | POA: Insufficient documentation

## 2021-09-20 DIAGNOSIS — D259 Leiomyoma of uterus, unspecified: Secondary | ICD-10-CM | POA: Insufficient documentation

## 2021-09-20 DIAGNOSIS — O4443 Low lying placenta NOS or without hemorrhage, third trimester: Secondary | ICD-10-CM | POA: Insufficient documentation

## 2021-09-20 DIAGNOSIS — O09523 Supervision of elderly multigravida, third trimester: Secondary | ICD-10-CM

## 2021-09-20 DIAGNOSIS — O09512 Supervision of elderly primigravida, second trimester: Secondary | ICD-10-CM | POA: Diagnosis not present

## 2021-09-20 DIAGNOSIS — O4442 Low lying placenta NOS or without hemorrhage, second trimester: Secondary | ICD-10-CM

## 2021-09-20 DIAGNOSIS — O3413 Maternal care for benign tumor of corpus uteri, third trimester: Secondary | ICD-10-CM

## 2021-09-20 DIAGNOSIS — O09513 Supervision of elderly primigravida, third trimester: Secondary | ICD-10-CM

## 2021-09-26 ENCOUNTER — Ambulatory Visit (INDEPENDENT_AMBULATORY_CARE_PROVIDER_SITE_OTHER): Payer: Medicaid Other | Admitting: Advanced Practice Midwife

## 2021-09-26 VITALS — BP 118/72 | HR 83 | Wt 174.9 lb

## 2021-09-26 DIAGNOSIS — O4443 Low lying placenta NOS or without hemorrhage, third trimester: Secondary | ICD-10-CM

## 2021-09-26 DIAGNOSIS — Z3402 Encounter for supervision of normal first pregnancy, second trimester: Secondary | ICD-10-CM

## 2021-09-26 DIAGNOSIS — O4440 Low lying placenta NOS or without hemorrhage, unspecified trimester: Secondary | ICD-10-CM

## 2021-09-26 DIAGNOSIS — R42 Dizziness and giddiness: Secondary | ICD-10-CM

## 2021-09-26 DIAGNOSIS — Z3A33 33 weeks gestation of pregnancy: Secondary | ICD-10-CM

## 2021-09-26 NOTE — Progress Notes (Signed)
Pt presents for ROB. No complaints.

## 2021-09-26 NOTE — Progress Notes (Addendum)
PRENATAL VISIT NOTE  Subjective:  Sandra Taylor is a 36 y.o. G1P0 at 18w2dbeing seen today for ongoing prenatal care.  She is currently monitored for the following issues for this low-risk pregnancy and has Supervision of high risk pregnancy, antepartum and Placenta previa on their problem list.  Patient reports  2 episodes of dizziness that resolved with PO fluids .  Contractions: Not present. Vag. Bleeding: None.  Movement: Present. Denies leaking of fluid.   The following portions of the patient's history were reviewed and updated as appropriate: allergies, current medications, past family history, past medical history, past social history, past surgical history and problem list.   Objective:   Vitals:   09/26/21 0842  BP: 118/72  Pulse: 83  Weight: 174 lb 14.4 oz (79.3 kg)    Fetal Status: Fetal Heart Rate (bpm): 146 Fundal Height: 33 cm Movement: Present     General:  Alert, oriented and cooperative. Patient is in no acute distress.  Skin: Skin is warm and dry. No rash noted.   Cardiovascular: Normal heart rate noted  Respiratory: Normal respiratory effort, no problems with respiration noted  Abdomen: Soft, gravid, appropriate for gestational age.  Pain/Pressure: Absent     Pelvic: Cervical exam deferred        Extremities: Normal range of motion.  Edema: None  Mental Status: Normal mood and affect. Normal behavior. Normal judgment and thought content.   Assessment and Plan:  Pregnancy: G1P0 at 329w2d. Supervision of low-risk first pregnancy, second trimester --Anticipatory guidance about next visits/weeks of pregnancy given.  - Pt interested in waterbirth and has not yet attended the class. Encouraged to register today. - Reviewed conditions in labor that will risk her out of water immersion including thick meconium or blood stained amniotic fluid, non-reassuring fetal status on monitor, excessive bleeding, hypertension, dizziness, use of IV meds, damaged equipment or  staffing that does not allow for water immersion, etc.  - The attending midwife must be on the unit for water immersion to begin; pt understands this may delay the start of water immersion. - Reminded pt that signing consent in labor at the hospital also acknowledges they will exit the tub if the attending midwife requests. - Consent given to patient for review.  Consent will be reviewed and signed at the hospital by the waterbirth provider prior to use of the tub. - Discussed other labor support options if waterbirth becomes unavailable, including position change, freedom of movement, use of birthing ball, and/or use of hydrotherapy in the shower (dependent upon medical condition/provider discretion).    2. Low-lying placenta --Resolved on [place]/14  3. [redacted] weeks gestation of pregnancy   4. 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   --BP was 100s/60s, came up to 110s/70s after PO fluids --Hgb 12/2 with 28 week labs, pt eating regularly, so likely orthostatic hypotension --Pt to watch, keep hydrated, add sodium to diet, and notify office if worsening  Preterm labor symptoms and general obstetric precautions including but not limited to vaginal bleeding, contractions, leaking of fluid and fetal movement were reviewed in detail with the patient. Please refer to After Visit Summary for other counseling recommendations.   Return in about 2 weeks (around 10/10/2021) for As scheduled.  Future Appointments  Date Time Provider De[place]7/08/2021  8:35 AM Leftwich-Kirby, LiKathie DikeCNM [facility] None  10/16/2021  8:35 AM Leftwich-Kirby, LiKathie DikeCNM [facility] None  10/24/2021  8:35 AM Leftwich-Kirby, LiKathie Dike  CNM [place] None  10/30/2021  8:35 AM Leftwich-Kirby, Kathie Dike, CNM [facility] None    Fatima Blank, CNM

## 2021-10-11 ENCOUNTER — Encounter: Payer: Medicaid Other | Admitting: Advanced Practice Midwife

## 2021-10-11 NOTE — Progress Notes (Deleted)
   PRENATAL VISIT NOTE  Subjective:  Sandra Taylor is a 36 y.o. G1P0 at 50w3dbeing seen today for ongoing prenatal care.  She is currently monitored for the following issues for this {Blank single:19197::"high-risk","low-risk"} pregnancy and has Supervision of high risk pregnancy, antepartum and Placenta previa on their problem list.  Patient reports {sx:14538}.   .  .   . Denies leaking of fluid.   The following portions of the patient's history were reviewed and updated as appropriate: allergies, current medications, past family history, past medical history, past social history, past surgical history and problem list.   Objective:  There were no vitals filed for this visit.  Fetal Status:           General:  Alert, oriented and cooperative. Patient is in no acute distress.  Skin: Skin is warm and dry. No rash noted.   Cardiovascular: Normal heart rate noted  Respiratory: Normal respiratory effort, no problems with respiration noted  Abdomen: Soft, gravid, appropriate for gestational age.        Pelvic: {Blank single:19197::"Cervical exam performed in the presence of a chaperone","Cervical exam deferred"}        Extremities: Normal range of motion.     Mental Status: Normal mood and affect. Normal behavior. Normal judgment and thought content.   Assessment and Plan:  Pregnancy: G1P0 at 377w3d. Supervision of low-risk first pregnancy, second trimester ***  2. Dizziness ***  3. [redacted] weeks gestation of pregnancy ***  {Blank single:19197::"Term","Preterm"} labor symptoms and general obstetric precautions including but not limited to vaginal bleeding, contractions, leaking of fluid and fetal movement were reviewed in detail with the patient. Please refer to After Visit Summary for other counseling recommendations.   No follow-ups on file.  Future Appointments  Date Time Provider [place]7/08/2021  8:35 AM Leftwich-Kirby, LiKathie DikeCNM [facility] None  10/16/2021  8:35 AM  Leftwich-Kirby, LiKathie DikeCNM [facility] None  10/24/2021  8:35 AM Leftwich-Kirby, LiKathie DikeCNM [facility] None  10/30/2021  8:35 AM Leftwich-Kirby, LiKathie DikeCNM [facility] None    LiFatima BlankCNM

## 2021-10-16 ENCOUNTER — Ambulatory Visit (INDEPENDENT_AMBULATORY_CARE_PROVIDER_SITE_OTHER): Payer: Medicaid Other | Admitting: Advanced Practice Midwife

## 2021-10-16 ENCOUNTER — Other Ambulatory Visit (HOSPITAL_COMMUNITY)
Admission: RE | Admit: 2021-10-16 | Discharge: 2021-10-16 | Disposition: A | Discharge status: Home / Self Care | Payer: Medicaid Other | Source: Ambulatory Visit | Attending: Advanced Practice Midwife | Admitting: Advanced Practice Midwife

## 2021-10-16 VITALS — BP 113/71 | HR 89 | Wt 178.2 lb

## 2021-10-16 DIAGNOSIS — Z3493 Encounter for supervision of normal pregnancy, unspecified, third trimester: Secondary | ICD-10-CM | POA: Insufficient documentation

## 2021-10-16 DIAGNOSIS — Z3A36 36 weeks gestation of pregnancy: Secondary | ICD-10-CM

## 2021-10-16 NOTE — Progress Notes (Signed)
   PRENATAL VISIT NOTE  Subjective:  Sandra Taylor is a 36 y.o. G1P0 at 18w1dbeing seen today for ongoing prenatal care.  She is currently monitored for the following issues for this low-risk pregnancy and has Supervision of high risk pregnancy, antepartum and Placenta previa on their problem list.  Patient reports occasional contractions.  Contractions: Irritability. Vag. Bleeding: None.  Movement: Present. Denies leaking of fluid.   The following portions of the patient's history were reviewed and updated as appropriate: allergies, current medications, past family history, past medical history, past social history, past surgical history and problem list.   Objective:   Vitals:   10/16/21 0839  BP: 113/71  Pulse: 89  Weight: 178 lb 3.2 oz (80.8 kg)    Fetal Status: Fetal Heart Rate (bpm): 142 Fundal Height: 36 cm Movement: Present  Presentation: Vertex  General:  Alert, oriented and cooperative. Patient is in no acute distress.  Skin: Skin is warm and dry. No rash noted.   Cardiovascular: Normal heart rate noted  Respiratory: Normal respiratory effort, no problems with respiration noted  Abdomen: Soft, gravid, appropriate for gestational age.  Pain/Pressure: Absent     Pelvic: Cervical exam deferred        Extremities: Normal range of motion.  Edema: None  Mental Status: Normal mood and affect. Normal behavior. Normal judgment and thought content.   Assessment and Plan:  Pregnancy: G1P0 at 371w1d. [redacted] weeks gestation of pregnancy   2. Supervision of low-risk pregnancy, third trimester --Anticipatory guidance about next visits/weeks of pregnancy given.  --Pt with intermittent cramping after busy day on Saturday, resolved with rest.  Labor precautions reviewed. --Pt scheduled for waterbirth class 7/20.   Discussed with pt that there are no barriers today, when pt admitted to hospital, if no barriers and pt still desires immersion, consent will be reviewed and signed. --Vertex  position confirmed by [place] and bedside [place] office today  Preterm labor symptoms and general obstetric precautions including but not limited to vaginal bleeding, contractions, leaking of fluid and fetal movement were reviewed in detail with the patient. Please refer to After Visit Summary for other counseling recommendations.   Return in about 1 week (around 10/23/2021) for Midwife preferred, As scheduled.  Future Appointments  Date Time Provider De[place]7/18/2023  8:35 AM Leftwich-Kirby, LiKathie DikeCNM [facility] None  10/30/2021  8:35 AM Leftwich-Kirby, LiKathie DikeCNM [facility] None    LiFatima BlankCNM

## 2021-10-16 NOTE — Patient Instructions (Signed)
Things to Try After 37 weeks to Encourage Labor/Get Ready for Labor:    Try the Miles Circuit at www.milescircuit.com daily to improve baby's position and encourage the onset of labor.  Walk a little and rest a little every day.  Change positions often.  Cervical Ripening: May try one or both Red Raspberry Leaf capsules or tea:  two 300mg or 400mg tablets with each meal, 2-3 times a day, or 1-3 cups of tea daily  Potential Side Effects Of Raspberry Leaf:  Most women do not experience any side effects from drinking raspberry leaf tea. However, nausea and loose stools are possible   Evening Primrose Oil capsules: take 1 capsule by mouth and place one capsule in the vagina every night.    Some of the potential side effects:  Upset stomach  Loose stools or diarrhea  Headaches  Nausea  Sex can also help the cervix ripen and encourage labor onset.    Labor Precautions Reasons to come to MAU at [facility]:  1.  Contractions are  5 minutes apart or less, each last 1 minute, these have been going on for 1-2 hours, and you cannot walk or talk during them 2.  You have a large gush of fluid, or a trickle of fluid that will not stop and you have to wear a pad 3.  You have bleeding that is bright red, heavier than spotting--like menstrual bleeding (spotting can be normal in early labor or after a check of your cervix) 4.  You do not feel the baby moving like he/she normally does  

## 2021-10-17 LAB — GC/CHLAMYDIA PROBE AMP (~~LOC~~) NOT AT ARMC
Chlamydia: NEGATIVE
Comment: NEGATIVE
Comment: NORMAL
Neisseria Gonorrhea: NEGATIVE

## 2021-10-18 LAB — STREP GP B NAA: Strep Gp B NAA: POSITIVE — AB

## 2021-10-24 ENCOUNTER — Ambulatory Visit (INDEPENDENT_AMBULATORY_CARE_PROVIDER_SITE_OTHER): Payer: Medicaid Other | Admitting: Advanced Practice Midwife

## 2021-10-24 VITALS — BP 119/79 | HR 93 | Wt 180.0 lb

## 2021-10-24 DIAGNOSIS — Z3A37 37 weeks gestation of pregnancy: Secondary | ICD-10-CM

## 2021-10-24 DIAGNOSIS — Z3493 Encounter for supervision of normal pregnancy, unspecified, third trimester: Secondary | ICD-10-CM

## 2021-10-24 DIAGNOSIS — Z3403 Encounter for supervision of normal first pregnancy, third trimester: Secondary | ICD-10-CM

## 2021-10-24 NOTE — Progress Notes (Signed)
Pt presents for ROB visit. Pt requesting cervical check.  

## 2021-10-24 NOTE — Progress Notes (Signed)
   PRENATAL VISIT NOTE  Subjective:  Sandra Taylor is a 36 y.o. G1P0 at 60w2dbeing seen today for ongoing prenatal care.  She is currently monitored for the following issues for this low-risk pregnancy and has Supervision of high risk pregnancy, antepartum and Placenta previa on their problem list.  Patient reports occasional contractions.  Contractions: Irritability. Vag. Bleeding: None.  Movement: Present. Denies leaking of fluid.   The following portions of the patient's history were reviewed and updated as appropriate: allergies, current medications, past family history, past medical history, past social history, past surgical history and problem list.   Objective:   Vitals:   10/24/21 0840  BP: 119/79  Pulse: 93  Weight: 180 lb (81.6 kg)    Fetal Status: Fetal Heart Rate (bpm): 143 Fundal Height: 37 cm Movement: Present  Presentation: Vertex  General:  Alert, oriented and cooperative. Patient is in no acute distress.  Skin: Skin is warm and dry. No rash noted.   Cardiovascular: Normal heart rate noted  Respiratory: Normal respiratory effort, no problems with respiration noted  Abdomen: Soft, gravid, appropriate for gestational age.  Pain/Pressure: Absent     Pelvic: Cervical exam performed in the presence of a chaperone Dilation: Closed Effacement (%): 70 Station: -3  Extremities: Normal range of motion.  Edema: Trace  Mental Status: Normal mood and affect. Normal behavior. Normal judgment and thought content.   Assessment and Plan:  Pregnancy: G1P0 at 332w2d. Supervision of low-risk pregnancy, third trimester --Anticipatory guidance about next visits/weeks of pregnancy given.  - Pt interested in waterbirth and has attended the class.  - Reviewed conditions in labor that will risk her out of water immersion including thick meconium or blood stained amniotic fluid, non-reassuring fetal status on monitor, excessive bleeding, hypertension, dizziness, use of IV meds, damaged  equipment or staffing that does not allow for water immersion, etc.  - The attending midwife must be on the unit for water immersion to begin; pt understands this may delay the start of water immersion. - Reminded pt that signing consent in labor at the hospital also acknowledges they will exit the tub if the attending midwife requests. - Consent given to patient for review.  Consent will be reviewed and signed at the hospital by the waterbirth provider prior to use of the tub. - Discussed other labor support options if waterbirth becomes unavailable, including position change, freedom of movement, use of birthing ball, and/or use of hydrotherapy in the shower (dependent upon medical condition/provider discretion).    2. [redacted] weeks gestation of pregnancy   Term labor symptoms and general obstetric precautions including but not limited to vaginal bleeding, contractions, leaking of fluid and fetal movement were reviewed in detail with the patient. Please refer to After Visit Summary for other counseling recommendations.   No follow-ups on file.  Future Appointments  Date Time Provider [place]7/24/2023  8:35 AM Leftwich-Kirby, LiKathie DikeCNM [facility] None  11/07/2021  8:55 AM Mercado-Ortiz, JeErnestine ConradDO [facility] None    LiFatima BlankCNM

## 2021-10-28 ENCOUNTER — Inpatient Hospital Stay (HOSPITAL_COMMUNITY)
Admission: AD | Admit: 2021-10-28 | Discharge: 2021-10-28 | Disposition: A | Discharge status: Home / Self Care | Payer: Medicaid Other | Attending: Obstetrics & Gynecology | Admitting: Obstetrics & Gynecology

## 2021-10-28 ENCOUNTER — Encounter (HOSPITAL_COMMUNITY): Payer: Self-pay | Admitting: Obstetrics & Gynecology

## 2021-10-28 ENCOUNTER — Inpatient Hospital Stay (HOSPITAL_COMMUNITY): Payer: Medicaid Other

## 2021-10-28 ENCOUNTER — Other Ambulatory Visit: Payer: Self-pay

## 2021-10-28 DIAGNOSIS — J069 Acute upper respiratory infection, unspecified: Secondary | ICD-10-CM | POA: Insufficient documentation

## 2021-10-28 DIAGNOSIS — Z20822 Contact with and (suspected) exposure to covid-19: Secondary | ICD-10-CM | POA: Diagnosis not present

## 2021-10-28 DIAGNOSIS — R000 Tachycardia, unspecified: Secondary | ICD-10-CM | POA: Diagnosis not present

## 2021-10-28 DIAGNOSIS — Z3689 Encounter for other specified antenatal screening: Secondary | ICD-10-CM | POA: Insufficient documentation

## 2021-10-28 DIAGNOSIS — Z3A37 37 weeks gestation of pregnancy: Secondary | ICD-10-CM | POA: Insufficient documentation

## 2021-10-28 DIAGNOSIS — R059 Cough, unspecified: Secondary | ICD-10-CM | POA: Insufficient documentation

## 2021-10-28 DIAGNOSIS — R0981 Nasal congestion: Secondary | ICD-10-CM | POA: Diagnosis not present

## 2021-10-28 DIAGNOSIS — O26893 Other specified pregnancy related conditions, third trimester: Secondary | ICD-10-CM | POA: Diagnosis present

## 2021-10-28 DIAGNOSIS — R0789 Other chest pain: Secondary | ICD-10-CM | POA: Diagnosis not present

## 2021-10-28 LAB — URINALYSIS, ROUTINE W REFLEX MICROSCOPIC
Bilirubin Urine: NEGATIVE
Glucose, UA: NEGATIVE mg/dL
Hgb urine dipstick: NEGATIVE
Ketones, ur: NEGATIVE mg/dL
Nitrite: NEGATIVE
Protein, ur: NEGATIVE mg/dL
Specific Gravity, Urine: 1.003 — ABNORMAL LOW (ref 1.005–1.030)
pH: 7 (ref 5.0–8.0)

## 2021-10-28 LAB — RESP PANEL BY RT-PCR (FLU A&B, COVID) ARPGX2
Influenza A by PCR: NEGATIVE
Influenza B by PCR: NEGATIVE
SARS Coronavirus 2 by RT PCR: NEGATIVE

## 2021-10-28 MED ORDER — ACETAMINOPHEN 500 MG PO TABS
1000.0000 mg | ORAL_TABLET | Freq: Four times a day (QID) | ORAL | Status: DC | PRN
  Administered 2021-10-28: 1000 mg via ORAL
  Filled 2021-10-28: qty 2

## 2021-10-28 NOTE — MAU Note (Signed)
Sandra Taylor is a 36 y.o. at 38w6dhere in MAU reporting: yesterday felt like she was coming down with a cold. This morning was congested and started feeling like her chest was tight. States HR has increased throughout the day.   Onset of complaint: yesterday  Pain score: 4/10  Vitals:   10/28/21 1744  BP: 129/79  Pulse: 100  Resp: 20  Temp: 98.2 F (36.8 C)  SpO2: 99%     FHT:160  Lab orders placed from triage: UA

## 2021-10-28 NOTE — MAU Provider Note (Signed)
History     CSN: [id number]  Arrival date and time: 10/28/21 1729   Event Date/Time   First Provider Initiated Contact with Patient 10/28/21 1813      Chief Complaint  Patient presents with   Tachycardia   Cough   Nasal Congestion   Chest Pain   36 y.o. G1 '@37'$ .6 wks presenting with cough and chest tightness. Symptoms started yesterday but became worse today. Cough is non-productive. Endorses congestion. Reports SOB and tachycardia with ambulation. Denies fevers. 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 Reports +FM. No pregnancy complaints.     OB History     Gravida  1   Para      Term      Preterm      AB      Living         SAB      IAB      Ectopic      Multiple      Live Births              Past Medical History:  Diagnosis Date   Medical history non-contributory    Nonspecific reaction to tuberculin skin test without active tuberculosis(795.51) 05/26/2008   Qualifier: Diagnosis of  By: Drue Flirt  MD, Merrily Brittle      Past Surgical History:  Procedure Laterality Date   NO PAST SURGERIES      Family History  Problem Relation Age of Onset   Diabetes Mother    Hypertension Mother    Diabetes Father        borderline    Social History   Tobacco Use   Smoking status: Never   Smokeless tobacco: Never  Vaping Use   Vaping Use: Never used  Substance Use Topics   Alcohol use: No   Drug use: No    Allergies:  Allergies  Allergen Reactions   Latex Itching    With gloves   Pork-Derived Products Other (See Comments)    Religious preference     Medications Prior to Admission  Medication Sig Dispense Refill Last Dose   aspirin EC 81 MG tablet Take 1 tablet (81 mg total) by mouth daily. Swallow whole. 30 tablet 11 10/28/2021   famotidine (PEPCID) 20 MG tablet Take 1 tablet (20 mg total) by mouth 2 (two) times daily. 60 tablet 3 10/28/2021   Prenatal Vit-Fe Fumarate-FA (MULTIVITAMIN-PRENATAL) 27-0.8 MG TABS tablet Take  1 tablet by mouth daily at 12 noon.   10/28/2021   acetaminophen (TYLENOL) 325 MG tablet Take 325 mg by mouth every 6 (six) hours as needed.       Review of Systems  Constitutional:  Negative for chills and fever.  HENT:  Positive for congestion and rhinorrhea.   Respiratory:  Positive for cough and shortness of breath.   Cardiovascular:  Positive for chest pain.  Gastrointestinal:  Negative for abdominal pain.  Genitourinary:  Negative for vaginal bleeding.   Physical Exam   Blood pressure 119/72, pulse 76, temperature 98.2 F (36.8 C), temperature source Oral, resp. rate 20, height 5' 5.5" (1.664 m), weight 84 kg, last menstrual period 02/14/2021, SpO2 100 %.  Physical Exam Vitals and nursing note reviewed.  Constitutional:      General: She is not in acute distress.    Appearance: Normal appearance.  HENT:     Head: Normocephalic and atraumatic.  Cardiovascular:     Rate and Rhythm: Regular rhythm. Tachycardia present.  Heart sounds: Normal heart sounds.  Pulmonary:     Effort: Pulmonary effort is normal. No respiratory distress.     Breath sounds: Normal breath sounds. No stridor. No wheezing, rhonchi or rales.  Musculoskeletal:        General: Normal range of motion.     Cervical back: Normal range of motion.  Skin:    General: Skin is warm and dry.  Neurological:     General: No focal deficit present.     Mental Status: She is alert and oriented to person, place, and time.  Psychiatric:        Mood and Affect: Mood normal.        Behavior: Behavior normal.   EFM: 145 bpm, mod variability, + accels, no decels Toco: UI  Results for orders placed or performed during the hospital encounter of 10/28/21 (from the past 24 hour(s))  Urinalysis, Routine w reflex microscopic Urine, Clean Catch     Status: Abnormal   Collection Time: 10/28/21  5:44 PM  Result Value Ref Range   Color, Urine STRAW (A) YELLOW   APPearance HAZY (A) CLEAR   Specific Gravity, Urine 1.003 (L)  1.005 - 1.030   pH 7.0 5.0 - 8.0   Glucose, UA NEGATIVE NEGATIVE mg/dL   Hgb urine dipstick NEGATIVE NEGATIVE   Bilirubin Urine NEGATIVE NEGATIVE   Ketones, ur NEGATIVE NEGATIVE mg/dL   Protein, ur NEGATIVE NEGATIVE mg/dL   Nitrite NEGATIVE NEGATIVE   Leukocytes,Ua SMALL (A) NEGATIVE   RBC / HPF 0-5 0 - 5 RBC/hpf   WBC, UA 6-10 0 - 5 WBC/hpf   Bacteria, UA MANY (A) NONE SEEN   Squamous Epithelial / LPF 11-20 0 - 5  Resp Panel by RT-PCR (Flu A&B, Covid) Anterior Nasal Swab     Status: None   Collection Time: 10/28/21  6:27 PM   Specimen: Anterior Nasal Swab  Result Value Ref Range   SARS Coronavirus 2 by RT PCR NEGATIVE NEGATIVE   Influenza A by PCR NEGATIVE NEGATIVE   Influenza B by PCR NEGATIVE NEGATIVE   DG Chest Port 1 View  Result Date: 10/28/2021 CLINICAL DATA:  Shortness of breath EXAM: PORTABLE CHEST 1 VIEW COMPARISON:  05/31/2008 FINDINGS: Shallow inspiration. Heart size and pulmonary vascularity are normal for technique. Lungs are clear. No pleural effusions. No pneumothorax. Mediastinal contours appear intact. IMPRESSION: Shallow inspiration.  No evidence of active pulmonary disease. Electronically Signed   By: Lucienne Capers M.D.   On: 10/28/2021 19:19    MAU Course  Procedures  MDM Labs ordered and reviewed. Sx consistent with viral URI, discussed supportive care and symptom management. Stable for discharge home.   Assessment and Plan   1. [redacted] weeks gestation of pregnancy   2. NST (non-stress test) reactive   3. Upper respiratory virus    Discharge home Follow up at [facility] as scheduled Return precautions  Allergies as of 10/28/2021       Reactions   Latex Itching   With gloves   Pork-derived Products Other (See Comments)   Religious preference        Medication List     TAKE these medications    acetaminophen 325 MG tablet Commonly known as: TYLENOL Take 325 mg by mouth every 6 (six) hours as needed.   aspirin EC 81 MG tablet Take 1 tablet (81  mg total) by mouth daily. Swallow whole.   famotidine 20 MG tablet Commonly known as: Pepcid Take 1 tablet (20 mg total) by  mouth 2 (two) times daily.   multivitamin-prenatal 27-0.8 MG Tabs tablet Take 1 tablet by mouth daily at 12 noon.       Julianne Handler, CNM 10/28/2021, 8:01 PM

## 2021-10-28 NOTE — MAU Note (Signed)
Lab, Xray and EKG results all reviewed with Patient per Peri Jefferson CNM.  Vaginal exam:  Dilation: Closed Exam by:: [facility] Bhrambri CNM,   Also reviewed contraction pattern and that non-stress test is reactive.  Pt requested Tylenol for low back pain. With position change and heat to lower back patient reported back pain has decreased significantly  A discharge order and diagnosis entered by a provider.   Labor discharge instructions reviewed with patient.     As well instructions for care of viral respiratory systems and retesting if she feels it is needed.  Pt voiced understanding and when to return for care. NAD. Ambulatory without SOB, chest pain or discomfort. Baby active.

## 2021-10-29 LAB — CULTURE, OB URINE: Culture: NO GROWTH

## 2021-10-30 ENCOUNTER — Ambulatory Visit (INDEPENDENT_AMBULATORY_CARE_PROVIDER_SITE_OTHER): Payer: Medicaid Other | Admitting: Advanced Practice Midwife

## 2021-10-30 VITALS — BP 121/78 | HR 88 | Wt 182.2 lb

## 2021-10-30 DIAGNOSIS — Z3493 Encounter for supervision of normal pregnancy, unspecified, third trimester: Secondary | ICD-10-CM

## 2021-10-30 DIAGNOSIS — Z3A38 38 weeks gestation of pregnancy: Secondary | ICD-10-CM

## 2021-10-30 DIAGNOSIS — O0990 Supervision of high risk pregnancy, unspecified, unspecified trimester: Secondary | ICD-10-CM

## 2021-10-30 NOTE — Progress Notes (Signed)
   PRENATAL VISIT NOTE  Subjective:  Sandra Taylor is a 36 y.o. G1P0 at [redacted]w[redacted]d being seen today for ongoing prenatal care.  She is currently monitored for the following issues for this low-risk pregnancy and has Supervision of high risk pregnancy, antepartum and Placenta previa on their problem list.  Patient reports occasional contractions.  Contractions: Irregular. Vag. Bleeding: None.  Movement: Present. Denies leaking of fluid.   The following portions of the patient's history were reviewed and updated as appropriate: allergies, current medications, past family history, past medical history, past social history, past surgical history and problem list.   Objective:   Vitals:   10/30/21 0850  BP: 121/78  Pulse: 88  Weight: 182 lb 3.2 oz (82.6 kg)    Fetal Status: Fetal Heart Rate (bpm): 136 Fundal Height: 38 cm Movement: Present  Presentation: Vertex  General:  Alert, oriented and cooperative. Patient is in no acute distress.  Skin: Skin is warm and dry. No rash noted.   Cardiovascular: Normal heart rate noted  Respiratory: Normal respiratory effort, no problems with respiration noted  Abdomen: Soft, gravid, appropriate for gestational age.  Pain/Pressure: Absent     Pelvic: Cervical exam performed in the presence of a chaperone Dilation: Fingertip Effacement (%): 70 Station: -3  Extremities: Normal range of motion.  Edema: Trace  Mental Status: Normal mood and affect. Normal behavior. Normal judgment and thought content.   Assessment and Plan:  Pregnancy: G1P0 at [redacted]w[redacted]d 1. Supervision of low-risk pregnancy, third trimester --Anticipatory guidance about next visits/weeks of pregnancy given.  --Pt reports after discussing with her husband and mom, she may not want to do a waterbirth.  Discussed alternatives including shower, birth ball, walking, etc and supported pt birth choices.  2. [redacted] weeks gestation of pregnancy   Term labor symptoms and general obstetric precautions  including but not limited to vaginal bleeding, contractions, leaking of fluid and fetal movement were reviewed in detail with the patient. Please refer to After Visit Summary for other counseling recommendations.   Return in about 1 week (around 11/06/2021) for Midwife preferred.  Future Appointments  Date Time Provider Department Center  11/07/2021  8:55 AM Mercado-Ortiz, Lahoma Crocker, DO [facility] None    Sharen Counter, CNM

## 2021-10-30 NOTE — Patient Instructions (Signed)
Labor Precautions Reasons to come to MAU at [facility]:  1.  Contractions are  5 minutes apart or less, each last 1 minute, these have been going on for 1-2 hours, and you cannot walk or talk during them 2.  You have a large gush of fluid, or a trickle of fluid that will not stop and you have to wear a pad 3.  You have bleeding that is bright red, heavier than spotting--like menstrual bleeding (spotting can be normal in early labor or after a check of your cervix) 4.  You do not feel the baby moving like he/she normally does  

## 2021-10-30 NOTE — Progress Notes (Signed)
Pt reports fetal movement with some irregular contractions.

## 2021-10-31 ENCOUNTER — Telehealth (INDEPENDENT_AMBULATORY_CARE_PROVIDER_SITE_OTHER): Payer: Self-pay | Admitting: Pediatrics

## 2021-10-31 DIAGNOSIS — Z7681 Expectant parent(s) prebirth pediatrician visit: Secondary | ICD-10-CM

## 2021-11-03 ENCOUNTER — Encounter: Payer: Self-pay | Admitting: Pediatrics

## 2021-11-03 DIAGNOSIS — Z7681 Expectant parent(s) prebirth pediatrician visit: Secondary | ICD-10-CM | POA: Insufficient documentation

## 2021-11-03 NOTE — Progress Notes (Signed)
Virtual Visit via Telephone Note  I connected with Sandra Taylor on 11/03/21 at 12:15 PM EDT by telephone and verified that I am speaking with the correct person using two identifiers.  Location: Patient: Home  Provider: Office   I discussed the limitations, risks, security and privacy concerns of performing an evaluation and management service by telephone and the availability of in person appointments. I also discussed with the patient that there may be a patient responsible charge related to this service. The patient expressed understanding and agreed to proceed.   History of Present Illness: Prenatal counseling for impending newborn done-- Z76.81       I provided 15 minutes of non-face-to-face time during this encounter.   Marcha Solders, MD

## 2021-11-06 ENCOUNTER — Encounter: Payer: Self-pay | Admitting: Obstetrics and Gynecology

## 2021-11-06 ENCOUNTER — Ambulatory Visit (INDEPENDENT_AMBULATORY_CARE_PROVIDER_SITE_OTHER): Payer: Medicaid Other | Admitting: Obstetrics and Gynecology

## 2021-11-06 VITALS — BP 115/78 | HR 84 | Wt 184.5 lb

## 2021-11-06 DIAGNOSIS — O0990 Supervision of high risk pregnancy, unspecified, unspecified trimester: Secondary | ICD-10-CM

## 2021-11-06 DIAGNOSIS — O0993 Supervision of high risk pregnancy, unspecified, third trimester: Secondary | ICD-10-CM

## 2021-11-06 DIAGNOSIS — Z3A39 39 weeks gestation of pregnancy: Secondary | ICD-10-CM

## 2021-11-06 NOTE — Progress Notes (Signed)
Pt presents for ROB reports hearing a "pop" while attempting to void today.

## 2021-11-06 NOTE — Progress Notes (Signed)
   PRENATAL VISIT NOTE  Subjective:  Sandra Taylor is a 36 y.o. G1P0 at 75w1dbeing seen today for ongoing prenatal care.  She is currently monitored for the following issues for this low-risk pregnancy and has Supervision of high risk pregnancy, antepartum; Placenta previa; and Pre-birth visit for expectant parents on their problem list.  Patient reports potential leakage of fluid.  Contractions: Irritability. Vag. Bleeding: None.  Movement: Present. Denies leaking of fluid.   The following portions of the patient's history were reviewed and updated as appropriate: allergies, current medications, past family history, past medical history, past social history, past surgical history and problem list.   Objective:   Vitals:   11/06/21 1608  BP: 115/78  Pulse: 84  Weight: 184 lb 8 oz (83.7 kg)    Fetal Status: Fetal Heart Rate (bpm): 164 (Simultaneous filing. User may not have seen previous data.) Fundal Height: 39 cm Movement: Present  Presentation: Vertex  General:  Alert, oriented and cooperative. Patient is in no acute distress.  Skin: Skin is warm and dry. No rash noted.   Cardiovascular: Normal heart rate noted  Respiratory: Normal respiratory effort, no problems with respiration noted  Abdomen: Soft, gravid, appropriate for gestational age.  Pain/Pressure: Present     Pelvic: Cervical exam performed in the presence of a chaperone Dilation: Fingertip Effacement (%): 70 Station: -3  Extremities: Normal range of motion.  Edema: Trace  Mental Status: Normal mood and affect. Normal behavior. Normal judgment and thought content.   Assessment and Plan:  Pregnancy: G1P0 at 364w1d. Supervision of high risk pregnancy, antepartum Patient is doing well without complaints Reassurance provided as no evidence of rupture of membrane Plan for IOL at 41 weeks - CBC; Standing - Type and screen; Standing - RPR; Standing  Term labor symptoms and general obstetric precautions including but not  limited to vaginal bleeding, contractions, leaking of fluid and fetal movement were reviewed in detail with the patient. Please refer to After Visit Summary for other counseling recommendations.   Return in about 1 week (around 11/13/2021) for in person, ROB, Low risk.  No future appointments.  PeMora BellmanMD

## 2021-11-07 ENCOUNTER — Telehealth (HOSPITAL_COMMUNITY): Payer: Self-pay | Admitting: *Deleted

## 2021-11-07 ENCOUNTER — Encounter (HOSPITAL_COMMUNITY): Payer: Self-pay

## 2021-11-07 ENCOUNTER — Encounter: Payer: Medicaid Other | Admitting: Family Medicine

## 2021-11-07 NOTE — Telephone Encounter (Signed)
Preadmission screen  

## 2021-11-10 ENCOUNTER — Encounter (HOSPITAL_COMMUNITY): Payer: Self-pay | Admitting: *Deleted

## 2021-11-10 ENCOUNTER — Telehealth (HOSPITAL_COMMUNITY): Payer: Self-pay | Admitting: *Deleted

## 2021-11-10 NOTE — Telephone Encounter (Signed)
Preadmission screen  

## 2021-11-12 ENCOUNTER — Other Ambulatory Visit: Payer: Self-pay | Admitting: Advanced Practice Midwife

## 2021-11-13 ENCOUNTER — Encounter: Payer: Self-pay | Admitting: Advanced Practice Midwife

## 2021-11-13 ENCOUNTER — Ambulatory Visit (INDEPENDENT_AMBULATORY_CARE_PROVIDER_SITE_OTHER): Payer: Medicaid Other | Admitting: Advanced Practice Midwife

## 2021-11-13 VITALS — BP 128/81 | HR 90 | Wt 189.0 lb

## 2021-11-13 DIAGNOSIS — Z3493 Encounter for supervision of normal pregnancy, unspecified, third trimester: Secondary | ICD-10-CM

## 2021-11-13 DIAGNOSIS — Z3A40 40 weeks gestation of pregnancy: Secondary | ICD-10-CM

## 2021-11-13 DIAGNOSIS — O0993 Supervision of high risk pregnancy, unspecified, third trimester: Secondary | ICD-10-CM

## 2021-11-13 DIAGNOSIS — O0990 Supervision of high risk pregnancy, unspecified, unspecified trimester: Secondary | ICD-10-CM

## 2021-11-13 NOTE — Progress Notes (Unsigned)
   PRENATAL VISIT NOTE  Subjective:  Sandra Taylor is a 36 y.o. G1P0 at 31w1dbeing seen today for ongoing prenatal care.  She is currently monitored for the following issues for this {Blank single:19197::"high-risk","low-risk"} pregnancy and has Supervision of high risk pregnancy, antepartum; Placenta previa; and Pre-birth visit for expectant parents on their problem list.  Patient reports {sx:14538}.  Contractions: Irritability. Vag. Bleeding: None.  Movement: Present. Denies leaking of fluid.   The following portions of the patient's history were reviewed and updated as appropriate: allergies, current medications, past family history, past medical history, past social history, past surgical history and problem list.   Objective:   Vitals:   11/13/21 1606  BP: 128/81  Pulse: 90  Weight: 189 lb (85.7 kg)    Fetal Status: Fetal Heart Rate (bpm): 140   Movement: Present     General:  Alert, oriented and cooperative. Patient is in no acute distress.  Skin: Skin is warm and dry. No rash noted.   Cardiovascular: Normal heart rate noted  Respiratory: Normal respiratory effort, no problems with respiration noted  Abdomen: Soft, gravid, appropriate for gestational age.  Pain/Pressure: Absent     Pelvic: {Blank single:19197::"Cervical exam performed in the presence of a chaperone","Cervical exam deferred"}        Extremities: Normal range of motion.  Edema: Trace  Mental Status: Normal mood and affect. Normal behavior. Normal judgment and thought content.   Assessment and Plan:  Pregnancy: G1P0 at 454w1d. Supervision of low-risk pregnancy, third trimester *** --NST reactive in office today, IOL scheduled at 41 weeks on 8/13 --Offered membrane sweep, pt declined today --Cervix 1/70/posterior --Encouraged pt to do the [employer], and daily if possible --Reviewed providers on this week as pt prefers midwives  2. [redacted] weeks gestation of pregnancy ***  {Blank  single:19197::"Term","Preterm"} labor symptoms and general obstetric precautions including but not limited to vaginal bleeding, contractions, leaking of fluid and fetal movement were reviewed in detail with the patient. Please refer to After Visit Summary for other counseling recommendations.   No follow-ups on file.  Future Appointments  Date Time Provider [place]8/13/2023  6:30 AM [facility] [place]    LiFatima BlankCNM

## 2021-11-14 ENCOUNTER — Encounter: Payer: Self-pay | Admitting: Advanced Practice Midwife

## 2021-11-19 ENCOUNTER — Encounter (HOSPITAL_COMMUNITY): Payer: Self-pay | Admitting: Obstetrics and Gynecology

## 2021-11-19 ENCOUNTER — Inpatient Hospital Stay (HOSPITAL_COMMUNITY)
Admission: AD | Admit: 2021-11-19 | Discharge: 2021-11-23 | DRG: 768 | Disposition: A | Discharge status: Home / Self Care | Payer: Medicaid Other | Attending: Family Medicine | Admitting: Family Medicine

## 2021-11-19 ENCOUNTER — Inpatient Hospital Stay (HOSPITAL_COMMUNITY): Payer: Medicaid Other

## 2021-11-19 ENCOUNTER — Other Ambulatory Visit: Payer: Self-pay

## 2021-11-19 DIAGNOSIS — O99214 Obesity complicating childbirth: Secondary | ICD-10-CM | POA: Diagnosis present

## 2021-11-19 DIAGNOSIS — O480 Post-term pregnancy: Secondary | ICD-10-CM | POA: Diagnosis present

## 2021-11-19 DIAGNOSIS — O99820 Streptococcus B carrier state complicating pregnancy: Secondary | ICD-10-CM | POA: Diagnosis not present

## 2021-11-19 DIAGNOSIS — D62 Acute posthemorrhagic anemia: Secondary | ICD-10-CM | POA: Diagnosis not present

## 2021-11-19 DIAGNOSIS — Z3A41 41 weeks gestation of pregnancy: Secondary | ICD-10-CM

## 2021-11-19 DIAGNOSIS — O9081 Anemia of the puerperium: Secondary | ICD-10-CM | POA: Diagnosis not present

## 2021-11-19 DIAGNOSIS — O99824 Streptococcus B carrier state complicating childbirth: Secondary | ICD-10-CM | POA: Diagnosis present

## 2021-11-19 DIAGNOSIS — O09523 Supervision of elderly multigravida, third trimester: Secondary | ICD-10-CM | POA: Diagnosis not present

## 2021-11-19 DIAGNOSIS — O4443 Low lying placenta NOS or without hemorrhage, third trimester: Secondary | ICD-10-CM | POA: Diagnosis not present

## 2021-11-19 DIAGNOSIS — O0990 Supervision of high risk pregnancy, unspecified, unspecified trimester: Secondary | ICD-10-CM

## 2021-11-19 DIAGNOSIS — Z3490 Encounter for supervision of normal pregnancy, unspecified, unspecified trimester: Secondary | ICD-10-CM | POA: Diagnosis present

## 2021-11-19 DIAGNOSIS — Z91040 Latex allergy status: Secondary | ICD-10-CM | POA: Diagnosis not present

## 2021-11-19 LAB — CBC
HCT: 38.5 % (ref 36.0–46.0)
Hemoglobin: 13.1 g/dL (ref 12.0–15.0)
MCH: 31.6 pg (ref 26.0–34.0)
MCHC: 34 g/dL (ref 30.0–36.0)
MCV: 93 fL (ref 80.0–100.0)
Platelets: 187 10*3/uL (ref 150–400)
RBC: 4.14 MIL/uL (ref 3.87–5.11)
RDW: 12.8 % (ref 11.5–15.5)
WBC: 4.9 10*3/uL (ref 4.0–10.5)
nRBC: 0 % (ref 0.0–0.2)

## 2021-11-19 MED ORDER — LACTATED RINGERS IV SOLN
500.0000 mL | INTRAVENOUS | Status: DC | PRN
  Administered 2021-11-20: 500 mL via INTRAVENOUS

## 2021-11-19 MED ORDER — OXYTOCIN-SODIUM CHLORIDE 30-0.9 UT/500ML-% IV SOLN
1.0000 m[IU]/min | INTRAVENOUS | Status: DC
  Administered 2021-11-20: 1 m[IU]/min via INTRAVENOUS
  Filled 2021-11-19: qty 500

## 2021-11-19 MED ORDER — LIDOCAINE HCL (PF) 1 % IJ SOLN: 30.0000 mL | INTRAMUSCULAR | Status: DC | PRN

## 2021-11-19 MED ORDER — OXYTOCIN-SODIUM CHLORIDE 30-0.9 UT/500ML-% IV SOLN: 2.5000 [IU]/h | INTRAVENOUS | Status: DC

## 2021-11-19 MED ORDER — TERBUTALINE SULFATE 1 MG/ML IJ SOLN: 0.2500 mg | Freq: Once | INTRAMUSCULAR | Status: DC | PRN

## 2021-11-19 MED ORDER — ACETAMINOPHEN 325 MG PO TABS: 650.0000 mg | ORAL_TABLET | ORAL | Status: DC | PRN

## 2021-11-19 MED ORDER — MISOPROSTOL 50MCG HALF TABLET
50.0000 ug | ORAL_TABLET | Freq: Once | ORAL | Status: AC
Start: 2021-11-19 — End: 2021-11-19
  Administered 2021-11-19: 50 ug via VAGINAL
  Filled 2021-11-19: qty 1

## 2021-11-19 MED ORDER — SODIUM CHLORIDE 0.9 % IV SOLN
5.0000 10*6.[IU] | Freq: Once | INTRAVENOUS | Status: AC
  Administered 2021-11-19: 5 10*6.[IU] via INTRAVENOUS
  Filled 2021-11-19: qty 5

## 2021-11-19 MED ORDER — OXYCODONE-ACETAMINOPHEN 5-325 MG PO TABS: 1.0000 | ORAL_TABLET | ORAL | Status: DC | PRN

## 2021-11-19 MED ORDER — ONDANSETRON HCL 4 MG/2ML IJ SOLN: 4.0000 mg | Freq: Four times a day (QID) | INTRAMUSCULAR | Status: DC | PRN

## 2021-11-19 MED ORDER — OXYTOCIN BOLUS FROM INFUSION
333.0000 mL | Freq: Once | INTRAVENOUS | Status: AC
  Administered 2021-11-20: 333 mL via INTRAVENOUS

## 2021-11-19 MED ORDER — SOD CITRATE-CITRIC ACID 500-334 MG/5ML PO SOLN
30.0000 mL | ORAL | Status: DC | PRN
  Administered 2021-11-20: 30 mL via ORAL
  Filled 2021-11-19: qty 30

## 2021-11-19 MED ORDER — MISOPROSTOL 50MCG HALF TABLET
50.0000 ug | ORAL_TABLET | Freq: Once | ORAL | Status: AC
Start: 2021-11-19 — End: 2021-11-19
  Administered 2021-11-19: 50 ug via ORAL
  Filled 2021-11-19: qty 1

## 2021-11-19 MED ORDER — OXYCODONE-ACETAMINOPHEN 5-325 MG PO TABS: 2.0000 | ORAL_TABLET | ORAL | Status: DC | PRN

## 2021-11-19 MED ORDER — LACTATED RINGERS IV SOLN: INTRAVENOUS | Status: DC

## 2021-11-19 MED ORDER — PENICILLIN G POT IN DEXTROSE 60000 UNIT/ML IV SOLN
3.0000 10*6.[IU] | INTRAVENOUS | Status: DC
  Administered 2021-11-20 (×4): 3 10*6.[IU] via INTRAVENOUS
  Filled 2021-11-19 (×4): qty 50

## 2021-11-19 NOTE — H&P (Signed)
OBSTETRIC ADMISSION HISTORY AND PHYSICAL  Sandra Taylor is a 36 y.o. female G1P0 with IUP at 60w0dby LMP presenting for IOL for AHoltand postdates. She reports +FMs, No LOF, no VB, no blurry vision, headaches or peripheral edema, and RUQ pain.  She plans on Breast feeding. She is unsure for birth control. [facility] --->  Estimated Date of Delivery: 11/12/21  Sono:   '@[redacted]w[redacted]d'$ , CWD, normal anatomy, vertex presentation, 1896g, 29% EFW   Prenatal History/Complications:   Nursing Staff Provider  Office Location  [facility]  [facility]12  [facility] Traditional '[ ]'$  Centering '[ ]'$  Mom-Baby Dyad    Language  English Anatomy [place]   Flu Vaccine  Completed  Genetic/Carrier Screen  NIPS:   LR female AFP:   neg Horizon:  TDaP Vaccine   08-16-21 Hgb A1C or  GTT Early  Third trimester nml  COVID Vaccine Completed    LAB RESULTS   Rhogam  Not indicated Blood Type --/--/O POS [facility] [address] [place] [address] ([phone])   Baby Feeding Plan Breast  Antibody    Contraception IUD?  Rubella    Circumcision Girl  RPR Non Reactive (05/10 1045)   Pediatrician  Undecided  HBsAg     Support Person Husband  HCVAb   Prenatal Classes No  HIV Non Reactive (05/10 1045)     BTL Consent  GBS Positive/-- (07/10 0912) (For PCN allergy, check sensitivities)   VBAC Consent  Pap        DME Rx [x ] BP cuff [x ] Weight Scale Waterbirth  '[ ]'$  Class '[ ]'$  Consent '[ ]'$  CNM visit  PSpalding[x  ] new OB [ X ] 28 weeks  [ X ] 36 weeks Induction  '[ ]'$  Orders Entered '[ ]'$ Foley Y/N    Past Medical History: Past Medical History:  Diagnosis Date   Family history of adverse reaction to anesthesia    mom but uncertain what   Medical history non-contributory    Nonspecific reaction to tuberculin skin test without active tuberculosis(795.51) 05/26/2008   Qualifier: Diagnosis of  By: BDrue Flirt MD, TMerrily Brittle     Past Surgical  History: Past Surgical History:  Procedure Laterality Date   CYSTECTOMY     NO PAST SURGERIES      Obstetrical History: OB History     Gravida  1   Para      Term      Preterm      AB      Living         SAB      IAB      Ectopic      Multiple      Live Births              Social History Social History   Socioeconomic History   Marital status: Married    Spouse name: Not on file   Number of children: Not on file   Years of education: Not on file   Highest education level: Not on file  Occupational History   Not on file  Tobacco Use   Smoking status: Never   Smokeless tobacco: Never  Vaping Use   Vaping Use: Never used  Substance and Sexual Activity   Alcohol use: No   Drug use: No   Sexual  activity: Yes  Other Topics Concern   Not on file  Social History Narrative   Not on file   Social Determinants of Health   Financial Resource Strain: Not on file  Food Insecurity: Not on file  Transportation Needs: Not on file  Physical Activity: Not on file  Stress: Not on file  Social Connections: Not on file    Family History: Family History  Problem Relation Age of Onset   Diabetes Mother    Hypertension Mother    Diabetes Father        borderline    Allergies: Allergies  Allergen Reactions   Latex Itching    With gloves   Pork-Derived Products Other (See Comments)    Religious preference     Medications Prior to Admission  Medication Sig Dispense Refill Last Dose   Prenatal Vit-Fe Fumarate-FA (MULTIVITAMIN-PRENATAL) 27-0.8 MG TABS tablet Take 1 tablet by mouth daily at 12 noon.   Past Week   acetaminophen (TYLENOL) 325 MG tablet Take 325 mg by mouth every 6 (six) hours as needed. (Patient not taking: Reported on 11/06/2021)      aspirin EC 81 MG tablet Take 1 tablet (81 mg total) by mouth daily. Swallow whole. (Patient not taking: Reported on 11/13/2021) 30 tablet 11    famotidine (PEPCID) 20 MG tablet Take 1 tablet (20 mg total)  by mouth 2 (two) times daily. (Patient not taking: Reported on 11/13/2021) 60 tablet 3     Review of Systems   All systems reviewed and negative except as stated in HPI  Blood pressure 134/81, pulse 98, resp. rate 17, last menstrual period 02/14/2021. General appearance: alert, cooperative, and appears stated age Lungs: clear to auscultation bilaterally Heart: regular rate and rhythm Abdomen: soft, non-tender; bowel sounds normal Pelvic: SVE 1/thick/-3 Extremities: Homans sign is negative, no sign of DVT Presentation: cephalic Fetal monitoringBaseline: 150 bpm, Variability: Good {> 6 bpm), Accelerations: Reactive, and Decelerations: Absent Uterine activity Date/time of onset: 5-6     Prenatal labs: ABO, Rh: --/--/PENDING (08/13 1900) Antibody: PENDING (08/13 1900) Rubella:   RPR: Non Reactive (05/10 1045)  HBsAg:    HIV: Non Reactive (05/10 1045)  GBS: Positive/-- (07/10 0912)  1 hr Glucola 97 Genetic screening  normal Anatomy US placenta previa resolved  Prenatal Transfer Tool  Maternal Diabetes: No Genetic Screening: Normal Maternal Ultrasounds/Referrals: Other:low-lying placenta/placenta previa-- resolved at 32wk Fetal Ultrasounds or other Referrals:  None Maternal Substance Abuse:  No Significant Maternal Medications:  None Significant Maternal Lab Results: Group B Strep positive  Results for orders placed or performed during the hospital encounter of 11/19/21 (from the past 24 hour(s))  CBC   Collection Time: 11/19/21  6:50 PM  Result Value Ref Range   WBC 4.9 4.0 - 10.5 K/uL   RBC 4.14 3.87 - 5.11 MIL/uL   Hemoglobin 13.1 12.0 - 15.0 g/dL   HCT 38.5 36.0 - 46.0 %   MCV 93.0 80.0 - 100.0 fL   MCH 31.6 26.0 - 34.0 pg   MCHC 34.0 30.0 - 36.0 g/dL   RDW 12.8 11.5 - 15.5 %   Platelets 187 150 - 400 K/uL   nRBC 0.0 0.0 - 0.2 %  Type and screen   Collection Time: 11/19/21  7:00 PM  Result Value Ref Range   ABO/RH(D) PENDING    Antibody Screen PENDING    Sample  Expiration      11/22/2021,2359 Performed at [facility], [place] [address], [place], [place] [address]  Patient Active Problem List   Diagnosis Date Noted   Encounter for induction of labor 11/19/2021   Pre-birth visit for expectant parents 11/03/2021   Supervision of high risk pregnancy, antepartum 05/18/2021   Placenta previa 05/18/2021    Assessment/Plan:  Sandra Taylor is a 36 y.o. G1P0 at 66w0dhere for IOL postdates  #Labor:Start cytotec buccal and vaginal 50 mcg. Will consider foley bulb at next sve check #Pain: Epidural upon request #FWB: CAT 1 #ID:  GBS pos, PCN start now #MOF: Breast #MOC: declines  JShelda Pal DO  11/19/2021, 7:37 PM

## 2021-11-20 ENCOUNTER — Inpatient Hospital Stay (HOSPITAL_COMMUNITY): Payer: Medicaid Other | Admitting: Anesthesiology

## 2021-11-20 ENCOUNTER — Telehealth: Payer: Self-pay

## 2021-11-20 ENCOUNTER — Encounter (HOSPITAL_COMMUNITY): Payer: Self-pay | Admitting: Obstetrics and Gynecology

## 2021-11-20 DIAGNOSIS — Z3A41 41 weeks gestation of pregnancy: Secondary | ICD-10-CM

## 2021-11-20 DIAGNOSIS — O4443 Low lying placenta NOS or without hemorrhage, third trimester: Secondary | ICD-10-CM

## 2021-11-20 DIAGNOSIS — O99820 Streptococcus B carrier state complicating pregnancy: Secondary | ICD-10-CM

## 2021-11-20 DIAGNOSIS — O09523 Supervision of elderly multigravida, third trimester: Secondary | ICD-10-CM

## 2021-11-20 DIAGNOSIS — O480 Post-term pregnancy: Secondary | ICD-10-CM

## 2021-11-20 LAB — RPR: RPR Ser Ql: NONREACTIVE

## 2021-11-20 LAB — CBC
HCT: 33 % — ABNORMAL LOW (ref 36.0–46.0)
Hemoglobin: 11.3 g/dL — ABNORMAL LOW (ref 12.0–15.0)
MCH: 31.8 pg (ref 26.0–34.0)
MCHC: 34.2 g/dL (ref 30.0–36.0)
MCV: 93 fL (ref 80.0–100.0)
Platelets: 152 10*3/uL (ref 150–400)
RBC: 3.55 MIL/uL — ABNORMAL LOW (ref 3.87–5.11)
RDW: 12.7 % (ref 11.5–15.5)
WBC: 8 10*3/uL (ref 4.0–10.5)
nRBC: 0 % (ref 0.0–0.2)

## 2021-11-20 MED ORDER — DIBUCAINE (PERIANAL) 1 % EX OINT
1.0000 | TOPICAL_OINTMENT | CUTANEOUS | Status: DC | PRN
  Administered 2021-11-22: 1 via RECTAL
  Filled 2021-11-20: qty 28

## 2021-11-20 MED ORDER — OXYTOCIN-SODIUM CHLORIDE 30-0.9 UT/500ML-% IV SOLN: 1.0000 m[IU]/min | INTRAVENOUS | Status: DC

## 2021-11-20 MED ORDER — SIMETHICONE 80 MG PO CHEW: 80.0000 mg | CHEWABLE_TABLET | ORAL | Status: DC | PRN

## 2021-11-20 MED ORDER — SODIUM CHLORIDE 0.9% FLUSH
3.0000 mL | Freq: Two times a day (BID) | INTRAVENOUS | Status: DC
  Administered 2021-11-21: 3 mL via INTRAVENOUS

## 2021-11-20 MED ORDER — PRENATAL MULTIVITAMIN CH
1.0000 | ORAL_TABLET | Freq: Every day | ORAL | Status: DC
Start: 2021-11-21 — End: 2021-11-23
  Administered 2021-11-21 – 2021-11-22 (×2): 1 via ORAL
  Filled 2021-11-20 (×3): qty 1

## 2021-11-20 MED ORDER — SALINE SPRAY 0.65 % NA SOLN
1.0000 | NASAL | Status: DC | PRN
  Administered 2021-11-20: 1 via NASAL
  Filled 2021-11-20: qty 44

## 2021-11-20 MED ORDER — PHENYLEPHRINE 80 MCG/ML (10ML) SYRINGE FOR IV PUSH (FOR BLOOD PRESSURE SUPPORT)
80.0000 ug | PREFILLED_SYRINGE | INTRAVENOUS | Status: DC | PRN
  Administered 2021-11-20: 160 ug via INTRAVENOUS
  Filled 2021-11-20: qty 10

## 2021-11-20 MED ORDER — SODIUM CHLORIDE 0.9% FLUSH: 3.0000 mL | INTRAVENOUS | Status: DC | PRN

## 2021-11-20 MED ORDER — IBUPROFEN 600 MG PO TABS
600.0000 mg | ORAL_TABLET | Freq: Four times a day (QID) | ORAL | Status: DC
  Administered 2021-11-20 – 2021-11-22 (×6): 600 mg via ORAL
  Filled 2021-11-20 (×9): qty 1

## 2021-11-20 MED ORDER — TRANEXAMIC ACID-NACL 1000-0.7 MG/100ML-% IV SOLN: 1000.0000 mg | INTRAVENOUS | Status: AC

## 2021-11-20 MED ORDER — TETANUS-DIPHTH-ACELL PERTUSSIS 5-2.5-18.5 LF-MCG/0.5 IM SUSY: 0.5000 mL | PREFILLED_SYRINGE | Freq: Once | INTRAMUSCULAR | Status: DC

## 2021-11-20 MED ORDER — SODIUM CHLORIDE 0.9 % IV SOLN: 250.0000 mL | INTRAVENOUS | Status: DC | PRN

## 2021-11-20 MED ORDER — LIDOCAINE HCL (PF) 1 % IJ SOLN
INTRAMUSCULAR | Status: DC | PRN
  Administered 2021-11-20 (×2): 4 mL via EPIDURAL

## 2021-11-20 MED ORDER — WITCH HAZEL-GLYCERIN EX PADS
1.0000 | MEDICATED_PAD | CUTANEOUS | Status: DC | PRN
  Administered 2021-11-20: 1 via TOPICAL

## 2021-11-20 MED ORDER — SENNOSIDES-DOCUSATE SODIUM 8.6-50 MG PO TABS
2.0000 | ORAL_TABLET | ORAL | Status: DC
  Administered 2021-11-21 – 2021-11-23 (×2): 2 via ORAL
  Filled 2021-11-20 (×3): qty 2

## 2021-11-20 MED ORDER — DIPHENHYDRAMINE HCL 50 MG/ML IJ SOLN: 12.5000 mg | INTRAMUSCULAR | Status: DC | PRN

## 2021-11-20 MED ORDER — DIPHENHYDRAMINE HCL 25 MG PO CAPS: 25.0000 mg | ORAL_CAPSULE | Freq: Four times a day (QID) | ORAL | Status: DC | PRN

## 2021-11-20 MED ORDER — EPHEDRINE 5 MG/ML INJ
10.0000 mg | INTRAVENOUS | Status: DC | PRN
  Filled 2021-11-20: qty 5

## 2021-11-20 MED ORDER — TRANEXAMIC ACID-NACL 1000-0.7 MG/100ML-% IV SOLN
INTRAVENOUS | Status: AC
  Administered 2021-11-20: 1000 mg
  Filled 2021-11-20: qty 100

## 2021-11-20 MED ORDER — ONDANSETRON HCL 4 MG/2ML IJ SOLN: 4.0000 mg | INTRAMUSCULAR | Status: DC | PRN

## 2021-11-20 MED ORDER — LACTATED RINGERS IV SOLN: 500.0000 mL | Freq: Once | INTRAVENOUS | Status: DC

## 2021-11-20 MED ORDER — FENTANYL-BUPIVACAINE-NACL 0.5-0.125-0.9 MG/250ML-% EP SOLN
12.0000 mL/h | EPIDURAL | Status: DC | PRN
  Administered 2021-11-20: 12 mL/h via EPIDURAL
  Filled 2021-11-20: qty 250

## 2021-11-20 MED ORDER — FENTANYL CITRATE (PF) 100 MCG/2ML IJ SOLN
100.0000 ug | Freq: Once | INTRAMUSCULAR | Status: AC
  Administered 2021-11-20: 50 ug via INTRAVENOUS
  Filled 2021-11-20: qty 2

## 2021-11-20 MED ORDER — EPHEDRINE 5 MG/ML INJ: 10.0000 mg | INTRAVENOUS | Status: DC | PRN

## 2021-11-20 MED ORDER — COCONUT OIL OIL: 1.0000 | TOPICAL_OIL | Status: DC | PRN

## 2021-11-20 MED ORDER — PHENYLEPHRINE 80 MCG/ML (10ML) SYRINGE FOR IV PUSH (FOR BLOOD PRESSURE SUPPORT)
80.0000 ug | PREFILLED_SYRINGE | INTRAVENOUS | Status: DC | PRN
  Administered 2021-11-20 (×2): 80 ug via INTRAVENOUS

## 2021-11-20 MED ORDER — ZOLPIDEM TARTRATE 5 MG PO TABS: 5.0000 mg | ORAL_TABLET | Freq: Every evening | ORAL | Status: DC | PRN

## 2021-11-20 MED ORDER — BENZOCAINE-MENTHOL 20-0.5 % EX AERO
1.0000 | INHALATION_SPRAY | CUTANEOUS | Status: DC | PRN
  Administered 2021-11-20 – 2021-11-23 (×2): 1 via TOPICAL
  Filled 2021-11-20 (×3): qty 56

## 2021-11-20 MED ORDER — ONDANSETRON HCL 4 MG PO TABS: 4.0000 mg | ORAL_TABLET | ORAL | Status: DC | PRN

## 2021-11-20 MED ORDER — ACETAMINOPHEN 325 MG PO TABS
650.0000 mg | ORAL_TABLET | ORAL | Status: DC | PRN
  Administered 2021-11-20 – 2021-11-22 (×3): 650 mg via ORAL
  Filled 2021-11-20 (×3): qty 2

## 2021-11-20 NOTE — Discharge Summary (Addendum)
Postpartum Discharge Summary  Date of Service updated 11/22/21    Patient Name: Sandra Taylor DOB: 1985-10-06 MRN: [id number]  Date of admission: 11/19/2021 Delivery date:11/20/2021  Delivering provider: Clarnce Flock  Date of discharge: 11/22/2021  Admitting diagnosis: Encounter for induction of labor [Z34.90] Intrauterine pregnancy: [redacted]w[redacted]d    Secondary diagnosis:  Principal Problem:   Encounter for induction of labor Active Problems:   Third degree laceration of perineum, type 3a  Additional problems: none    Discharge diagnosis: Term Pregnancy Delivered                                              Post partum procedures: 1u pRBC Augmentation: AROM, Pitocin, and Cytotec Complications: None  Hospital course: Induction of Labor With Vaginal Delivery   36y.o. yo G1P0 at 430w1das admitted to the hospital 11/19/2021 for induction of labor.  Indication for induction: Postdates.  Patient had an uncomplicated labor course as follows: Membrane Rupture Time/Date: 10:10 PM ,11/19/2021   Delivery Method:Vaginal, Spontaneous  Episiotomy: None  Lacerations:  3rd degree  Details of delivery can be found in separate delivery note.  Patient had postpartum course complicated by Amenia, Hgb 6.7. received 1 unit of packed red blood cells. Patient is discharged home 11/22/21.  Newborn Data: Birth date:11/20/2021  Birth time:1:44 PM  Gender:Female  Living status:Living  Apgars:8 ,9  Weight:3470 g   Magnesium Sulfate received: No BMZ received: No Rhophylac:N/A MMR:N/A T-DaP:Given prenatally Flu: N/A Transfusion:No  Physical exam  Vitals:   11/21/21 0541 11/21/21 1819 11/21/21 2155 11/22/21 0853  BP: 102/67 116/75 122/66 114/76  Pulse: 84 94 100 (!) 107  Resp: 18  19 18   Temp: (!) 97.5 F (36.4 C)  98.4 F (36.9 C) 98.9 F (37.2 C)  TempSrc: Oral  Oral Oral  SpO2:   100% 100%   General: alert, cooperative, and no distress Lochia: appropriate Uterine Fundus:  firm Incision: N/A DVT Evaluation: mild +1 pitting edema on exam Labs: Lab Results  Component Value Date   WBC 5.5 11/22/2021   HGB 6.7 (LL) 11/22/2021   HCT 20.3 (L) 11/22/2021   MCV 95.8 11/22/2021   PLT 128 (L) 11/22/2021      Latest Ref Rng & Units 01/10/2011    2:05 AM  CMP  Glucose 70 - 99 mg/dL 93   BUN 6 - 23 mg/dL 9   Creatinine 0.50 - 1.10 mg/dL <0.47   Sodium 135 - 145 mEq/L 134   Potassium 3.5 - 5.1 mEq/L 3.7   Chloride 96 - 112 mEq/L 99   CO2 19 - 32 mEq/L 27   Calcium 8.4 - 10.5 mg/dL 9.4    Edinburgh Score:    11/20/2021    5:41 PM  Edinburgh Postnatal Depression Scale Screening Tool  I have been able to laugh and see the funny side of things. 0  I have looked forward with enjoyment to things. 0  I have blamed myself unnecessarily when things went wrong. 0  I have been anxious or worried for no good reason. 0  I have felt scared or panicky for no good reason. 0  Things have been getting on top of me. 0  I have been so unhappy that I have had difficulty sleeping. 0  I have felt sad or miserable. 0  I have been so unhappy  that I have been crying. 0  The thought of harming myself has occurred to me. 0  Edinburgh Postnatal Depression Scale Total 0     After visit meds:  Allergies as of 11/22/2021       Reactions   Latex Itching   With gloves   Pork-derived Products Other (See Comments)   Religious preference        Medication List     TAKE these medications    acetaminophen 325 MG tablet Commonly known as: TYLENOL Take 325 mg by mouth every 6 (six) hours as needed. What changed: Another medication with the same name was added. Make sure you understand how and when to take each.   acetaminophen 325 MG tablet Commonly known as: Tylenol Take 2 tablets (650 mg total) by mouth every 4 (four) hours as needed (for pain scale < 4). What changed: You were already taking a medication with the same name, and this prescription was added. Make sure you  understand how and when to take each.   aspirin EC 81 MG tablet Take 1 tablet (81 mg total) by mouth daily. Swallow whole.   benzocaine-Menthol 20-0.5 % Aero Commonly known as: DERMOPLAST Apply 1 Application topically as needed for irritation (perineal discomfort).   famotidine 20 MG tablet Commonly known as: Pepcid Take 1 tablet (20 mg total) by mouth 2 (two) times daily.   ferrous sulfate 325 (65 FE) MG tablet Take 1 tablet (325 mg total) by mouth every other day. Start taking on: November 23, 2021   ibuprofen 600 MG tablet Commonly known as: ADVIL Take 1 tablet (600 mg total) by mouth every 6 (six) hours.   multivitamin-prenatal 27-0.8 MG Tabs tablet Take 1 tablet by mouth daily at 12 noon.   senna-docusate 8.6-50 MG tablet Commonly known as: Senokot-S Take 2 tablets by mouth 2 (two) times daily.        Discharge home in stable condition Infant Feeding: Breast Infant Disposition:home with mother Discharge instruction: per After Visit Summary and Postpartum booklet. Activity: Advance as tolerated. Pelvic rest for 6 weeks.  Diet: routine diet Future Appointments: Future Appointments  Date Time Provider [place]  12/04/2021 11:15 AM Constant, Vickii Chafe, MD [place] None  01/16/2022 10:55 AM Leftwich-Kirby, Kathie Dike, CNM [facility] None   Follow up Visit:  Message sent to [facility] on 11/22/21 by Shelda Pal, DO Please schedule this patient for a In person postpartum visit in 6 weeks with the following provider: Any provider. Additional Postpartum F/U: WOUND CHECK in 1 week   High risk pregnancy complicated by:  placenta previa, resolved Delivery mode:  Vaginal, Spontaneous  Anticipated Birth Control:   Declines   11/22/2021 Shelda Pal, DO

## 2021-11-20 NOTE — Anesthesia Preprocedure Evaluation (Signed)
Anesthesia Evaluation  Patient identified by MRN, date of birth, ID band Patient awake    Reviewed: Allergy & Precautions, Patient's Chart, lab work & pertinent test results  History of Anesthesia Complications (+) Family history of anesthesia reaction  Airway Mallampati: II       Dental no notable dental hx.    Pulmonary neg pulmonary ROS,    Pulmonary exam normal        Cardiovascular negative cardio ROS Normal cardiovascular exam     Neuro/Psych negative neurological ROS  negative psych ROS   GI/Hepatic Neg liver ROS, GERD  ,  Endo/Other  Obesity  Renal/GU negative Renal ROS  negative genitourinary   Musculoskeletal negative musculoskeletal ROS (+)   Abdominal (+) + obese,   Peds  Hematology negative hematology ROS (+)   Anesthesia Other Findings   Reproductive/Obstetrics (+) Pregnancy                             Anesthesia Physical Anesthesia Plan  ASA: 2  Anesthesia Plan: Epidural   Post-op Pain Management:    Induction:   PONV Risk Score and Plan:   Airway Management Planned: Natural Airway  Additional Equipment: None  Intra-op Plan:   Post-operative Plan:   Informed Consent: I have reviewed the patients History and Physical, chart, labs and discussed the procedure including the risks, benefits and alternatives for the proposed anesthesia with the patient or authorized representative who has indicated his/her understanding and acceptance.       Plan Discussed with: Anesthesiologist  Anesthesia Plan Comments:         Anesthesia Quick Evaluation

## 2021-11-20 NOTE — Lactation Note (Signed)
This note was copied from a baby's chart. Lactation Consultation Note  Patient Name: Sandra Taylor [id number] Date: 11/20/2021 Reason for consult: Initial assessment Age:37 hours  P1, Reviewed hand expression with drops expressed. LC view last latch with RN in room.  Lips flanged. Actively sucking.   Feed on demand with cues.  Goal 8-12+ times per day after first 24 hrs.  Place baby STS if not cueing.  Mom made aware of O/P services, breastfeeding support groups, community resources, and our phone # for post-discharge questions.    Maternal Data Has patient been taught Hand Expression?: Yes Does the patient have breastfeeding experience prior to this delivery?: No  Feeding Mother's Current Feeding Choice: Breast Milk  LATCH Score Latch: Grasps breast easily, tongue down, lips flanged, rhythmical sucking.  Audible Swallowing: None  Type of Nipple: Everted at rest and after stimulation  Comfort (Breast/Nipple): Soft / non-tender  Hold (Positioning): Assistance needed to correctly position infant at breast and maintain latch.  LATCH Score: 7   Interventions Interventions: Education;Hand express;LC Services brochure  Consult Status Consult Status: Follow-up Date: 11/21/21 Follow-up type: In-patient    [name] [facility] 11/20/2021, 6:33 PM

## 2021-11-20 NOTE — Lactation Note (Signed)
This note was copied from a baby's chart. Lactation Consultation Note  Patient Name: Sandra Taylor Date: 11/20/2021 Reason for consult: L&D Initial assessment;Term;Primapara;1st time breastfeeding Age:37 hours   Initial L&D Consult:  Visited with family > 1 hour after birth Assisted to latch easily in the cross cradle hold.  Baby began immediately sucking with a wide gape and flanged lips.  She was still actively feeding when I left the room.  Reassured family that lactation services will be available on the M/B unit.  Many visitors present.   Maternal Data    Feeding Mother's Current Feeding Choice: Breast Milk  LATCH Score Latch: Grasps breast easily, tongue down, lips flanged, rhythmical sucking.  Audible Swallowing: None  Type of Nipple: Everted at rest and after stimulation  Comfort (Breast/Nipple): Soft / non-tender  Hold (Positioning): Assistance needed to correctly position infant at breast and maintain latch.  LATCH Score: 7   Lactation Tools Discussed/Used    Interventions Interventions: Assisted with latch;Skin to skin  Discharge    Consult Status Consult Status: Follow-up from L&D    Lanice Schwab Savalas Monje 11/20/2021, 3:36 PM

## 2021-11-20 NOTE — Progress Notes (Signed)
Labor Progress Note Sandra Taylor is a 36 y.o. G1P0 at 2w1dpresented for IOL for post dates S:  she is uncomfortable. Contracting every 2-3 mins.  O:  BP (!) 123/56   Pulse 80   Temp 98.3 F (36.8 C) (Oral)   Resp 16   LMP 02/14/2021  EFM: 145 /moderate /+accels, no decels  CVE: Dilation: 1 Effacement (%): 80 Station: -3 Presentation: Vertex Exam by:: Dr. NTrina Ao  A&P: 36y.o. G1P0 471w1dere for IOL for post dates #Labor: Early labor, s/p 2 doses of misoprostol (PO and Va) Contractions are very frequent; will hold off further medication. Still too posterior to put in a balloon. #Pain: Fentanyl offered. Will consider epidural when in active labor #FWB: category 1 #GBS positive - on PCN  Azizi Bally J Sherrilyn RistMD 1:59 AM

## 2021-11-20 NOTE — Lactation Note (Signed)
This note was copied from a baby's chart. Lactation Consultation Note  Patient Name: Sandra Taylor [id number] Date: 11/20/2021 Reason for consult: Initial assessment;Mother's request;Difficult latch;Primapara;1st time breastfeeding;Term;Breastfeeding assistance Age:37 hours  Infant reviewed different positions to get a deeper latch. Infant tongue sucking and latching shallow. LC did some suck training, infant latched in sideline, birth parent preference. See flow sheet.   Plan 1. To feed based on cues 8-12x 24hr period. Birth parent to offer breasts and look for signs of milk transfer 2. Birth parent to hand express and offer colostrum then try a latch.  All questions answered at the end of the visit.   Maternal Data Has patient been taught Hand Expression?: Yes Does the patient have breastfeeding experience prior to this delivery?: No  Feeding Mother's Current Feeding Choice: Breast Milk  LATCH Score Latch: Repeated attempts needed to sustain latch, nipple held in mouth throughout feeding, stimulation needed to elicit sucking reflex.  Audible Swallowing: Spontaneous and intermittent  Type of Nipple: Everted at rest and after stimulation  Comfort (Breast/Nipple): Filling, red/small blisters or bruises, mild/mod discomfort (pain resolved with a deeper latch. Infant in sideline with pillow and bed role blanket supporting the latch. sister in assistance on the bed, birth parent prefers to not hold head.)  Hold (Positioning): Assistance needed to correctly position infant at breast and maintain latch.  LATCH Score: 7   Lactation Tools Discussed/Used    Interventions Interventions: Breast feeding basics reviewed;Assisted with latch;Skin to skin;Breast massage;Hand express;Breast compression;Adjust position;Support pillows;Position options;Education  Discharge    Consult Status Consult Status: Follow-up Date: 11/21/21 Follow-up type: In-patient    Marcellous Snarski   Nicholson-Springer 11/20/2021, 10:12 PM

## 2021-11-20 NOTE — Progress Notes (Signed)
Labor Progress Note Sandra Taylor is a 36 y.o. G1P0 at 40w1dpresented for IOL postdates  S: Pt not feeling pain 2/2 epidural. Wants to practice pushing  O:  BP 129/88   Pulse (!) 102   Temp 98.4 F (36.9 C) (Axillary)   Resp 16   LMP 02/14/2021   SpO2 100%  EFM: 140 bpm/Moderate variability/ 15x15 accels/ None decels  CVE: Dilation: 10 Dilation Complete Date: 11/20/21 Dilation Complete Time: 0842 Effacement (%): 80 Station: Plus 2 Presentation: Vertex Exam by:: Erin MacnDonald   A&P: 36y.o. G1P0 466w1dOL postdates #Labor: Progressing well. Increase pitocin dose. 2x2. Continue pushing #Pain: Epidural #FWB: CAT 1 #GBS positive- PCN completed   JeErnestine Conradercado-Ortiz, DO 12:52 PM

## 2021-11-20 NOTE — Telephone Encounter (Signed)
Newborn Notification for Dr. Laurice Record. Had prenatal though the phone. Notification sent.

## 2021-11-20 NOTE — Progress Notes (Signed)
RN called Dr. Ardine Eng LOT regarding nasal spray for the patient.

## 2021-11-20 NOTE — Anesthesia Procedure Notes (Signed)
Epidural Patient location during procedure: OB Start time: 11/20/2021 2:12 AM End time: 11/20/2021 2:20 AM  Staffing Anesthesiologist: Josephine Igo, MD Performed: anesthesiologist   Preanesthetic Checklist Completed: patient identified, IV checked, site marked, risks and benefits discussed, surgical consent, monitors and equipment checked, pre-op evaluation and timeout performed  Epidural Patient position: sitting Prep: DuraPrep and site prepped and draped Patient monitoring: continuous pulse ox and blood pressure Approach: midline Location: L3-L4 Injection technique: LOR air  Needle:  Needle type: Tuohy  Needle gauge: 17 G Needle length: 9 cm and 9 Needle insertion depth: 6 cm Catheter type: closed end flexible Catheter size: 19 Gauge Catheter at skin depth: 11 cm Test dose: negative and Other  Assessment Events: blood not aspirated, injection not painful, no injection resistance, no paresthesia and negative IV test  Additional Notes Patient identified. Risks and benefits discussed including failed block, incomplete  Pain control, post dural puncture headache, nerve damage, paralysis, blood pressure Changes, nausea, vomiting, reactions to medications-both toxic and allergic and post Partum back pain. All questions were answered. Patient expressed understanding and wished to proceed. Sterile technique was used throughout procedure. Epidural site was Dressed with sterile barrier dressing. No paresthesias, signs of intravascular injection Or signs of intrathecal spread were encountered.  Patient was more comfortable after the epidural was dosed. Please see RN's note for documentation of vital signs and FHR which are stable. Reason for block:procedure for pain

## 2021-11-21 LAB — CBC
HCT: 22.5 % — ABNORMAL LOW (ref 36.0–46.0)
HCT: 24.4 % — ABNORMAL LOW (ref 36.0–46.0)
Hemoglobin: 7.4 g/dL — ABNORMAL LOW (ref 12.0–15.0)
Hemoglobin: 8.3 g/dL — ABNORMAL LOW (ref 12.0–15.0)
MCH: 31.2 pg (ref 26.0–34.0)
MCH: 32 pg (ref 26.0–34.0)
MCHC: 32.9 g/dL (ref 30.0–36.0)
MCHC: 34 g/dL (ref 30.0–36.0)
MCV: 94.9 fL (ref 80.0–100.0)
MCV: 94.2 fL (ref 80.0–100.0)
Platelets: 138 10*3/uL — ABNORMAL LOW (ref 150–400)
Platelets: 156 10*3/uL (ref 150–400)
RBC: 2.37 MIL/uL — ABNORMAL LOW (ref 3.87–5.11)
RBC: 2.59 MIL/uL — ABNORMAL LOW (ref 3.87–5.11)
RDW: 13.1 % (ref 11.5–15.5)
RDW: 13.2 % (ref 11.5–15.5)
WBC: 7.8 10*3/uL (ref 4.0–10.5)
WBC: 9.1 10*3/uL (ref 4.0–10.5)
nRBC: 0 % (ref 0.0–0.2)
nRBC: 0 % (ref 0.0–0.2)

## 2021-11-21 MED ORDER — FERROUS SULFATE 325 (65 FE) MG PO TABS
325.0000 mg | ORAL_TABLET | ORAL | Status: DC
  Administered 2021-11-21 – 2021-11-23 (×2): 325 mg via ORAL
  Filled 2021-11-21 (×2): qty 1

## 2021-11-21 NOTE — Anesthesia Postprocedure Evaluation (Signed)
Anesthesia Post Note  Patient: Sandra Taylor  Procedure(s) Performed: AN AD HOC LABOR EPIDURAL     Patient location during evaluation: Mother Baby Anesthesia Type: Epidural Level of consciousness: awake and alert Pain management: pain level controlled Vital Signs Assessment: post-procedure vital signs reviewed and stable Respiratory status: spontaneous breathing, nonlabored ventilation and respiratory function stable Cardiovascular status: stable Postop Assessment: no headache, no backache and epidural receding Anesthetic complications: no   No notable events documented.  Last Vitals:  Vitals:   11/21/21 0355 11/21/21 0541  BP: (!) 90/50 102/67  Pulse: 90 84  Resp: 18 18  Temp: 36.7 C (!) 36.4 C  SpO2:      Last Pain:  Vitals:   11/21/21 0739  TempSrc:   PainSc: 0-No pain   Pain Goal:                   Matan Steen

## 2021-11-21 NOTE — Telephone Encounter (Signed)
Reviewed

## 2021-11-21 NOTE — Lactation Note (Signed)
This note was copied from a baby's chart. Lactation Consultation Note  Patient Name: Sandra Taylor [id number]'L Date: 11/21/2021 Reason for consult: Follow-up assessment;Mother's request Age:37 hours  P1, Birth parent requested assistance with latching. Birth parent has been worried about her milk supply and had challenges during cluster feeding last night and started supplementing w/ formula.   Assisted with latching in chair with pillows for support in cross cradle to cradle hold.  Baby latched with ease.  Suggest checking for depth and flanged lips.  Observed feeding for more than 15 min. With swallows and reassured family. Discussed probability of cluster feeding and wanting to be held tonight.  Birth parent's sister in room for support. Reminded birth parent to always offer breast first to establish milk supply.  Hand expressed more volume today than yesterday.    Maternal Data Has patient been taught Hand Expression?: Yes Does the patient have breastfeeding experience prior to this delivery?: No  Feeding Mother's Current Feeding Choice: Breast Milk and Formula  LATCH Score Latch: Grasps breast easily, tongue down, lips flanged, rhythmical sucking.  Audible Swallowing: A few with stimulation  Type of Nipple: Everted at rest and after stimulation  Comfort (Breast/Nipple): Soft / non-tender  Hold (Positioning): Assistance needed to correctly position infant at breast and maintain latch.  LATCH Score: 8   Lactation Tools Discussed/Used    Interventions Interventions: Assisted with latch;Breast feeding basics reviewed;Support pillows;Education  Discharge    Consult Status Consult Status: Follow-up Date: 11/22/21 Follow-up type: In-patient    [name] [facility] 11/21/2021, 12:32 PM

## 2021-11-21 NOTE — Progress Notes (Signed)
POSTPARTUM PROGRESS NOTE  Post Partum Day 1  Subjective:  Sandra Taylor is a 36 y.o. G1P1001 s/p VD at [redacted]w[redacted]d  She reports she is doing well. No acute events overnight. She denies any problems with ambulating, voiding or po intake. Denies nausea or vomiting.  Pain is well controlled.  Lochia is appropriate.  Patient reports she is not having any dizziness anymore.  Objective: Blood pressure 102/67, pulse 84, temperature (!) 97.5 F (36.4 C), temperature source Oral, resp. rate 18, last menstrual period 02/14/2021, SpO2 100 %, unknown if currently breastfeeding.  Physical Exam:  General: alert, cooperative and no distress Chest: no respiratory distress Heart:regular rate, distal pulses intact Abdomen: soft, nontender,  Uterine Fundus: firm, appropriately tender DVT Evaluation: No calf swelling or tenderness Extremities: +1 lower extremity edema Skin: warm, dry  Recent Labs    11/21/21 0544 11/21/21 0737  HGB 7.4* 8.3*  HCT 22.5* 24.4*    Assessment/Plan: Sandra COLUNGAis a 36y.o. G1P1001 s/p vaginal delivery at 474w1d PPD#1- Doing well  Routine postpartum care Anemia: Patient with approximately 600 mL of blood loss.  Immediate hemoglobin after delivery of 11 from 13.  Morning hemoglobin of 7.4.  Rechecked 8.3.  Will discuss Feraheme with patient.  Contraception: IUD Feeding: Breast Dispo: Plan for discharge tomorrow.   LOS: 2 days   ViGifford ShaveMD  11/21/2021, 9:57 AM

## 2021-11-21 NOTE — Progress Notes (Signed)
The Rn called Doctor regarding patient's Hemoglobin of 7.4. No further orders were given.

## 2021-11-22 ENCOUNTER — Other Ambulatory Visit (HOSPITAL_COMMUNITY): Payer: Self-pay

## 2021-11-22 LAB — CBC
HCT: 20.3 % — ABNORMAL LOW (ref 36.0–46.0)
Hemoglobin: 6.7 g/dL — CL (ref 12.0–15.0)
MCH: 31.6 pg (ref 26.0–34.0)
MCHC: 33 g/dL (ref 30.0–36.0)
MCV: 95.8 fL (ref 80.0–100.0)
Platelets: 128 10*3/uL — ABNORMAL LOW (ref 150–400)
RBC: 2.12 MIL/uL — ABNORMAL LOW (ref 3.87–5.11)
RDW: 13.3 % (ref 11.5–15.5)
WBC: 5.5 10*3/uL (ref 4.0–10.5)
nRBC: 0 % (ref 0.0–0.2)

## 2021-11-22 LAB — PREPARE RBC (CROSSMATCH)

## 2021-11-22 MED ORDER — IRON SUCROSE 20 MG/ML IV SOLN
500.0000 mg | Freq: Once | INTRAVENOUS | Status: DC
  Filled 2021-11-22: qty 25

## 2021-11-22 MED ORDER — FERROUS SULFATE 325 (65 FE) MG PO TABS
325.0000 mg | ORAL_TABLET | ORAL | 0 refills | Status: DC
  Filled 2021-11-22: qty 15, 30d supply, fill #0

## 2021-11-22 MED ORDER — SODIUM CHLORIDE 0.9% IV SOLUTION: Freq: Once | INTRAVENOUS | Status: AC

## 2021-11-22 MED ORDER — SENNOSIDES-DOCUSATE SODIUM 8.6-50 MG PO TABS
2.0000 | ORAL_TABLET | Freq: Two times a day (BID) | ORAL | 0 refills | Status: AC
  Filled 2021-11-22: qty 60, 15d supply, fill #0

## 2021-11-22 MED ORDER — BENZOCAINE-MENTHOL 20-0.5 % EX AERO
1.0000 | INHALATION_SPRAY | CUTANEOUS | 0 refills | Status: DC | PRN
  Filled 2021-11-22: qty 78, fill #0

## 2021-11-22 MED ORDER — ACETAMINOPHEN 325 MG PO TABS
650.0000 mg | ORAL_TABLET | ORAL | 0 refills | Status: DC | PRN
  Filled 2021-11-22: qty 30, 3d supply, fill #0

## 2021-11-22 MED ORDER — IBUPROFEN 600 MG PO TABS
600.0000 mg | ORAL_TABLET | Freq: Four times a day (QID) | ORAL | 0 refills | Status: DC
  Filled 2021-11-22: qty 30, 8d supply, fill #0

## 2021-11-22 NOTE — Progress Notes (Signed)
Latest Reference Range & Units 11/22/21 05:59  Hemoglobin 12.0 - 15.0 g/dL 6.7 (LL)  (LL): Data is critically low   Repeat CBC critically low. OB provider in delivery so unable to relay results. Secure chat sent for provider acknowledgment when available. Pt asymptomatic.    Rocky Crafts, RN 11/22/21

## 2021-11-22 NOTE — Progress Notes (Signed)
POSTPARTUM PROGRESS NOTE  Post Partum Day 2  Subjective:  Sandra Taylor is a 36 y.o. G1P1001 s/p SVD at [redacted]w[redacted]d  She reports she is doing well. No acute events overnight. She denies any problems with ambulating, voiding or po intake. Denies nausea or vomiting.  Pain is well controlled.  Lochia is adequate.  Objective: Blood pressure 114/76, pulse (!) 107, temperature 98.9 F (37.2 C), temperature source Oral, resp. rate 18, last menstrual period 02/14/2021, SpO2 100 %, unknown if currently breastfeeding.  Physical Exam:  General: alert, cooperative and no distress Chest: no respiratory distress Heart:regular rate, distal pulses intact Uterine Fundus: firm, appropriately tender DVT Evaluation: No calf swelling or tenderness Extremities: moderate edema Skin: warm, dry  Recent Labs    11/21/21 0737 11/22/21 0559  HGB 8.3* 6.7*  HCT 24.4* 20.3*    Assessment/Plan: Sandra TOMCZAKis a 36y.o. G1P1001 s/p SVD at 447w1d PPD#2 - Doing well  Routine postpartum care Anemia: Hgb 6.7. Discussed 1 unit, pt will think about this.  Contraception: none Feeding: Breast Dispo: Plan for discharge today vs tomorrow.   LOS: 3 days   JeShelda PalDO OB Fellow  11/22/2021, 9:41 AM

## 2021-11-22 NOTE — Lactation Note (Signed)
This note was copied from a baby's chart. Lactation Consultation Note  Patient Name: Sandra Taylor [id number] Date: 11/22/2021 Reason for consult: Follow-up assessment Age:37 hours  P1, Reviewed engorgement care and monitoring voids/stools. Encouraged breastfeeding before offering formula. Suggest pumping to encourage milk supply if giving large volumes of formula.   Feeding Mother's Current Feeding Choice: Breast Milk and Formula  Interventions Interventions: Education  Discharge Discharge Education: Engorgement and breast care;Warning signs for feeding baby Pump: Hands Free;Personal  Consult Status Consult Status: Follow-up Date: 11/22/21 Follow-up type: In-patient    [name] [facility] 11/22/2021, 10:34 AM

## 2021-11-22 NOTE — Discharge Summary (Signed)
Postpartum Discharge Summary  Date of Service updated 11/22/21    Patient Name: Sandra Taylor DOB: 07-17-1985 MRN: [id number]  Date of admission: 11/19/2021 Delivery date:11/20/2021  Delivering provider: Clarnce Flock  Date of discharge: 11/22/2021  Admitting diagnosis: Encounter for induction of labor [Z34.90] Intrauterine pregnancy: [redacted]w[redacted]d    Secondary diagnosis:  Principal Problem:   Encounter for induction of labor Active Problems:   Third degree laceration of perineum, type 3a  Additional problems: none    Discharge diagnosis: Term Pregnancy Delivered                                              Post partum procedures: 1u pRBC Augmentation: AROM, Pitocin, and Cytotec Complications: None  Hospital course: Induction of Labor With Vaginal Delivery   36y.o. yo G1P0 at 449w1das admitted to the hospital 11/19/2021 for induction of labor.  Indication for induction: Postdates.  Patient had an uncomplicated labor course as follows: Membrane Rupture Time/Date: 10:10 PM ,11/19/2021   Delivery Method:Vaginal, Spontaneous  Episiotomy: None  Lacerations:  3rd degree  Details of delivery can be found in separate delivery note.  Patient had postpartum course complicated by [place], Hgb 6.7. received 1 unit of packed red blood cells. Patient is discharged home 11/22/21.  Newborn Data: Birth date:11/20/2021  Birth time:1:44 PM  Gender:Female  Living status:Living  Apgars:8 ,9  Weight:3470 g   Magnesium Sulfate received: No BMZ received: No Rhophylac:N/A MMR:N/A T-DaP:Given prenatally Flu: N/A Transfusion:No  Physical exam  Vitals:   11/21/21 0541 11/21/21 1819 11/21/21 2155 11/22/21 0853  BP: 102/67 116/75 122/66 114/76  Pulse: 84 94 100 (!) 107  Resp: 18  19 18   Temp: (!) 97.5 F (36.4 C)  98.4 F (36.9 C) 98.9 F (37.2 C)  TempSrc: Oral  Oral Oral  SpO2:   100% 100%   General: alert, cooperative, and no distress Lochia: appropriate Uterine Fundus: firm Incision:  N/A DVT Evaluation: mild +1 pitting edema on exam Labs: Lab Results  Component Value Date   WBC 5.5 11/22/2021   HGB 6.7 (LL) 11/22/2021   HCT 20.3 (L) 11/22/2021   MCV 95.8 11/22/2021   PLT 128 (L) 11/22/2021      Latest Ref Rng & Units 01/10/2011    2:05 AM  CMP  Glucose 70 - 99 mg/dL 93   BUN 6 - 23 mg/dL 9   Creatinine 0.50 - 1.10 mg/dL <0.47   Sodium 135 - 145 mEq/L 134   Potassium 3.5 - 5.1 mEq/L 3.7   Chloride 96 - 112 mEq/L 99   CO2 19 - 32 mEq/L 27   Calcium 8.4 - 10.5 mg/dL 9.4    Edinburgh Score:    11/20/2021    5:41 PM  Edinburgh Postnatal Depression Scale Screening Tool  I have been able to laugh and see the funny side of things. 0  I have looked forward with enjoyment to things. 0  I have blamed myself unnecessarily when things went wrong. 0  I have been anxious or worried for no good reason. 0  I have felt scared or panicky for no good reason. 0  Things have been getting on top of me. 0  I have been so unhappy that I have had difficulty sleeping. 0  I have felt sad or miserable. 0  I have been so unhappy that I have  been crying. 0  The thought of harming myself has occurred to me. 0  Edinburgh Postnatal Depression Scale Total 0     After visit meds:  Allergies as of 11/22/2021       Reactions   Latex Itching   With gloves   Pork-derived Products Other (See Comments)   Religious preference        Medication List     TAKE these medications    acetaminophen 325 MG tablet Commonly known as: TYLENOL Take 325 mg by mouth every 6 (six) hours as needed. What changed: Another medication with the same name was added. Make sure you understand how and when to take each.   acetaminophen 325 MG tablet Commonly known as: Tylenol Take 2 tablets (650 mg total) by mouth every 4 (four) hours as needed (for pain scale < 4). What changed: You were already taking a medication with the same name, and this prescription was added. Make sure you understand how  and when to take each.   aspirin EC 81 MG tablet Take 1 tablet (81 mg total) by mouth daily. Swallow whole.   benzocaine-Menthol 20-0.5 % Aero Commonly known as: DERMOPLAST Apply 1 Application topically as needed for irritation (perineal discomfort).   famotidine 20 MG tablet Commonly known as: Pepcid Take 1 tablet (20 mg total) by mouth 2 (two) times daily.   ferrous sulfate 325 (65 FE) MG tablet Take 1 tablet (325 mg total) by mouth every other day. Start taking on: November 23, 2021   ibuprofen 600 MG tablet Commonly known as: ADVIL Take 1 tablet (600 mg total) by mouth every 6 (six) hours.   multivitamin-prenatal 27-0.8 MG Tabs tablet Take 1 tablet by mouth daily at 12 noon.   senna-docusate 8.6-50 MG tablet Commonly known as: Senokot-S Take 2 tablets by mouth 2 (two) times daily.        Discharge home in stable condition Infant Feeding: Breast Infant Disposition:home with mother Discharge instruction: per After Visit Summary and Postpartum booklet. Activity: Advance as tolerated. Pelvic rest for 6 weeks.  Diet: routine diet Future Appointments: Future Appointments  Date Time Provider [place]  12/04/2021 11:15 AM Constant, Vickii Chafe, MD [place] None  01/16/2022 10:55 AM Leftwich-Kirby, Kathie Dike, CNM [facility] None   Follow up Visit:  Message sent to [facility] on 11/22/21 by Shelda Pal, DO Please schedule this patient for a In person postpartum visit in 6 weeks with the following provider: Any provider. Additional Postpartum F/U: WOUND CHECK in 1 week   High risk pregnancy complicated by:  placenta previa, resolved Delivery mode:  Vaginal, Spontaneous  Anticipated Birth Control:   Declines   11/22/2021 Shelda Pal, DO

## 2021-11-22 NOTE — Progress Notes (Signed)
OB team notified of CBC; to confer with daytime and round on pt this morning.   Rocky Crafts, RN

## 2021-11-23 LAB — CBC WITH DIFFERENTIAL/PLATELET
Abs Immature Granulocytes: 0.03 10*3/uL (ref 0.00–0.07)
Basophils Absolute: 0 10*3/uL (ref 0.0–0.1)
Basophils Relative: 0 %
Eosinophils Absolute: 0.1 10*3/uL (ref 0.0–0.5)
Eosinophils Relative: 2 %
HCT: 24.3 % — ABNORMAL LOW (ref 36.0–46.0)
Hemoglobin: 8.3 g/dL — ABNORMAL LOW (ref 12.0–15.0)
Immature Granulocytes: 1 %
Lymphocytes Relative: 31 %
Lymphs Abs: 1.7 10*3/uL (ref 0.7–4.0)
MCH: 31.4 pg (ref 26.0–34.0)
MCHC: 34.2 g/dL (ref 30.0–36.0)
MCV: 92 fL (ref 80.0–100.0)
Monocytes Absolute: 0.4 10*3/uL (ref 0.1–1.0)
Monocytes Relative: 7 %
Neutro Abs: 3.3 10*3/uL (ref 1.7–7.7)
Neutrophils Relative %: 59 %
Platelets: 165 10*3/uL (ref 150–400)
RBC: 2.64 MIL/uL — ABNORMAL LOW (ref 3.87–5.11)
RDW: 15.3 % (ref 11.5–15.5)
WBC: 5.5 10*3/uL (ref 4.0–10.5)
nRBC: 0.4 % — ABNORMAL HIGH (ref 0.0–0.2)

## 2021-11-23 LAB — TYPE AND SCREEN
ABO/RH(D): O POS
Antibody Screen: NEGATIVE
Unit division: 0

## 2021-11-23 LAB — BPAM RBC
Blood Product Expiration Date: 202309172359
ISSUE DATE / TIME: 202308161357
Unit Type and Rh: 5100

## 2021-11-30 ENCOUNTER — Telehealth (HOSPITAL_COMMUNITY): Payer: Self-pay | Admitting: *Deleted

## 2021-11-30 NOTE — Telephone Encounter (Signed)
Left phone voicemail message.  Odis Hollingshead, RN 11-30-2021 at 2:43pm

## 2021-11-30 NOTE — Progress Notes (Signed)
Patient with acute blood loss anemia 2/2 blood loss during delivery.

## 2021-12-04 ENCOUNTER — Ambulatory Visit (INDEPENDENT_AMBULATORY_CARE_PROVIDER_SITE_OTHER): Payer: Medicaid Other | Admitting: Obstetrics and Gynecology

## 2021-12-04 ENCOUNTER — Encounter: Payer: Self-pay | Admitting: Obstetrics and Gynecology

## 2021-12-04 NOTE — Progress Notes (Signed)
    [place] Partum Visit Note  Sandra Taylor is a 36 y.o. G17P1001 female who presents for a postpartum visit. She is 1 weeks postpartum following a normal spontaneous vaginal delivery.  I have fully reviewed the prenatal and intrapartum course. The delivery was at 67 gestational weeks.  Anesthesia: epidural. Postpartum course has been complicated by anemia treated with 1 unit pRBC. Baby is doing well. Baby is feeding by both breast and bottle - Similac Advance. Bleeding no bleeding. Bowel function is normal. Bladder function is normal. Patient is not sexually active. Contraception method is abstinence. Postpartum depression screening: negative.   The pregnancy intention screening data noted above was reviewed. Potential methods of contraception were discussed. The patient elected to proceed with No data recorded.   Edinburgh Postnatal Depression Scale - 12/04/21 1158       Edinburgh Postnatal Depression Scale:  In the Past 7 Days   I have been able to laugh and see the funny side of things. 0    I have looked forward with enjoyment to things. 0    I have blamed myself unnecessarily when things went wrong. 0    I have been anxious or worried for no good reason. 0    I have felt scared or panicky for no good reason. 0    Things have been getting on top of me. 0    I have been so unhappy that I have had difficulty sleeping. 0    I have felt sad or miserable. 0    I have been so unhappy that I have been crying. 0    The thought of harming myself has occurred to me. 0    Edinburgh Postnatal Depression Scale Total 0             Health Maintenance Due  Topic Date Due   COVID-19 Vaccine (1) Never done   Hepatitis C Screening  Never done   PAP SMEAR-Modifier  12/25/2015   INFLUENZA VACCINE  11/07/2021       Review of Systems Pertinent items noted in HPI and remainder of comprehensive ROS otherwise negative.  Objective:  BP 120/77   Pulse 86   Wt 167 lb (75.8 kg)   LMP 02/14/2021    BMI 27.37 kg/m    General:  alert, cooperative, and no distress   Breasts:  normal  Lungs: clear to auscultation bilaterally  Heart:  regular rate and rhythm  Abdomen: soft, non-tender; bowel sounds normal; no masses,  no organomegaly   Wound   GU exam:   3rd degree laceration healing well       Assessment:    1. Routine postpartum follow-up Normal postpartum exam.   Plan:   CBC today given postpartum anemia Provided information regarding breast feeding RTC in 4 weeks  for postpartum visit   Mora Bellman, MD [facility] [place]

## 2021-12-05 LAB — CBC
Hematocrit: 37.6 % (ref 34.0–46.6)
Hemoglobin: 12.1 g/dL (ref 11.1–15.9)
MCH: 29.5 pg (ref 26.6–33.0)
MCHC: 32.2 g/dL (ref 31.5–35.7)
MCV: 92 fL (ref 79–97)
Platelets: 262 10*3/uL (ref 150–450)
RBC: 4.1 x10E6/uL (ref 3.77–5.28)
RDW: 14.1 % (ref 11.7–15.4)
WBC: 4.5 10*3/uL (ref 3.4–10.8)

## 2021-12-20 ENCOUNTER — Encounter: Payer: Self-pay | Admitting: Obstetrics & Gynecology

## 2021-12-20 ENCOUNTER — Ambulatory Visit (INDEPENDENT_AMBULATORY_CARE_PROVIDER_SITE_OTHER): Payer: Medicaid Other | Admitting: Obstetrics & Gynecology

## 2021-12-20 VITALS — BP 116/77 | HR 83 | Ht 65.5 in | Wt 171.1 lb

## 2021-12-20 DIAGNOSIS — R52 Pain, unspecified: Secondary | ICD-10-CM

## 2021-12-20 NOTE — Progress Notes (Signed)
   GYNECOLOGY OFFICE VISIT NOTE  History:   Sandra Taylor is a 36 y.o. G1P1001 here today for evaluation of her perineal laceration.  She has a 3a perineal laceration during vaginal delivery on 11/20/21. Had normal check up on 12/04/21.  However, on 12/15/2021 she started to have increased pain in area making it hard to sit. She evaluated herself with a mirror, did not see any concerning signs but her pain has continued. Pain is alleviated with Ibuprofen and Tylenol.  Also has unrelated left leg nerve pain, no swelling, no edema.  No fevers, no dysuria. Also denies any abnormal vaginal discharge, bleeding, pelvic pain or other concerns.    Past Medical History:  Diagnosis Date   Family history of adverse reaction to anesthesia    mom but uncertain what   Medical history non-contributory    Nonspecific reaction to tuberculin skin test without active tuberculosis(795.51) 05/26/2008   Qualifier: Diagnosis of  By: Drue Flirt  MD, Merrily Brittle      Past Surgical History:  Procedure Laterality Date   CYSTECTOMY     NO PAST SURGERIES      The following portions of the patient's history were reviewed and updated as appropriate: allergies, current medications, past family history, past medical history, past social history, past surgical history and problem list.   Review of Systems:  Pertinent items noted in HPI and remainder of comprehensive ROS otherwise negative.  Physical Exam:  BP 116/77   Pulse 83   Ht 5' 5.5" (1.664 m)   Wt 171 lb 1.6 oz (77.6 kg)   Breastfeeding Yes   BMI 28.04 kg/m  CONSTITUTIONAL: Well-developed, well-nourished female in no acute distress.  MUSCULOSKELETAL: Normal range of motion. No edema noted. NEUROLOGIC: Alert and oriented to person, place, and time. Normal muscle tone coordination. No cranial nerve deficit noted. PSYCHIATRIC: Normal mood and affect. Normal behavior. Normal judgment and thought content. CARDIOVASCULAR: Normal heart rate noted RESPIRATORY: Effort and  breath sounds normal, no problems with respiration noted ABDOMEN: No masses noted. No other overt distention noted.   PELVIC:  Well-healing perineal laceration. No erythema, no dehiscence, no drainage. Nontender to palpation. Some sutures still visible. Intact perineal body.     Assessment and Plan:     1. Pain from laceration 2. Third degree laceration of perineum, type 3a Benign examination, patient reassured. Laceration is still healing, this could be due to healing process. Less likely is the chance of deep infection, but can consider course of antibiotics if symptoms continue or worsen.  Leg pain is unrelated, could also be overall body;s adjustment post recent pregnancy. Will continue close observation for now. Patient told to contact us for any worsening symptoms.    Routine preventative health maintenance measures emphasized. Please refer to After Visit Summary for other counseling recommendations.   Return for any gynecologic concerns.    I spent 20 minutes dedicated to the care of this patient including pre-visit review of records, face to face time with the patient discussing her conditions and treatments and post visit orders.    Verita Schneiders, MD, [place] for [employer], [place]

## 2022-01-10 ENCOUNTER — Telehealth: Payer: Self-pay | Admitting: General Practice

## 2022-01-10 ENCOUNTER — Other Ambulatory Visit: Payer: Self-pay | Admitting: General Practice

## 2022-01-10 DIAGNOSIS — O26892 Other specified pregnancy related conditions, second trimester: Secondary | ICD-10-CM

## 2022-01-10 MED ORDER — FAMOTIDINE 20 MG PO TABS: 20.0000 mg | ORAL_TABLET | Freq: Two times a day (BID) | ORAL | 3 refills | Status: DC

## 2022-01-16 ENCOUNTER — Ambulatory Visit (INDEPENDENT_AMBULATORY_CARE_PROVIDER_SITE_OTHER): Payer: Medicaid Other | Admitting: Advanced Practice Midwife

## 2022-01-16 ENCOUNTER — Encounter: Payer: Self-pay | Admitting: Advanced Practice Midwife

## 2022-01-16 VITALS — BP 115/77 | HR 69 | Wt 170.4 lb

## 2022-01-16 DIAGNOSIS — R61 Generalized hyperhidrosis: Secondary | ICD-10-CM

## 2022-01-16 NOTE — Progress Notes (Signed)
[place] Partum Visit Note  Sandra Taylor is a 36 y.o. G23P1001 female who presents for a postpartum visit. She is 8 weeks postpartum following a normal spontaneous vaginal delivery.  I have fully reviewed the prenatal and intrapartum course. The delivery was at 53 gestational weeks.  Anesthesia: epidural. Postpartum course has been well. Baby is doing well. Baby is feeding by both breast and bottle - Similac 360 . Bleeding  reports having some moderate bleeding on and off . Bowel function is normal. Bladder function is normal. Patient is not sexually active. Contraception method is none. Postpartum depression screening: negative.   [place] - 01/16/22 1119       Pregnancy Intention Screening   Does the patient want to become pregnant in the next year? N/A    Does the patient's partner want to become pregnant in the next year? N/A    Would the patient like to discuss contraceptive options today? No            The pregnancy intention screening data noted above was reviewed. Potential methods of contraception were discussed. The patient elected to proceed with No data recorded.   Edinburgh Postnatal Depression Scale - 01/16/22 1120       Edinburgh Postnatal Depression Scale:  In the Past 7 Days   I have been able to laugh and see the funny side of things. 0    I have looked forward with enjoyment to things. 0    I have blamed myself unnecessarily when things went wrong. 0    I have been anxious or worried for no good reason. 0    I have felt scared or panicky for no good reason. 0    Things have been getting on top of me. 0    I have been so unhappy that I have had difficulty sleeping. 0    I have felt sad or miserable. 0    I have been so unhappy that I have been crying. 0    The thought of harming myself has occurred to me. 0    Edinburgh Postnatal Depression Scale Total 0             Health Maintenance Due  Topic Date Due   Hepatitis C Screening  Never done   PAP  SMEAR-Modifier  12/25/2015   INFLUENZA VACCINE  11/07/2021    The following portions of the patient's history were reviewed and updated as appropriate: allergies, current medications, past family history, past medical history, past social history, past surgical history, and problem list.  Review of Systems Pertinent items noted in HPI and remainder of comprehensive ROS otherwise negative.  Objective:  BP 115/77   Pulse 69   Wt 170 lb 6.4 oz (77.3 kg)   Breastfeeding Yes   BMI 27.92 kg/m    VS reviewed, nursing note reviewed,  Constitutional: well developed, well nourished, no distress HEENT: normocephalic CV: normal rate Pulm/chest wall: normal effort Abdomen: soft Neuro: alert and oriented x 3 Skin: warm, dry Psych: affect normal       Assessment:   1. Unexplained night sweats --Since delivery, improving but still present  - CBC - TSH --F/U in office in 2 months  2. Third degree laceration of perineum, type 3a --Pt reports pain x 1-2 weeks, then improvement, then another episode of pain at 5 weeks that has now resolved.  She was seen in the office on 8/28 and on 9/13 and had exam by Drs Constant and  Anyanwu which were normal. --Today, pt is doing well, no pain, has not resumed intercourse with her [place] of [place].  No problems with urination or bowel movements.   3. Postpartum examination following vaginal delivery    Plan:   Essential components of care per ACOG recommendations:  1.  Mood and well being: Patient with negative depression screening today. Reviewed local resources for support.  - Patient tobacco use? No.   - hx of drug use? No.    2. Infant care and feeding:  -Patient currently breastmilk feeding? Yes. Discussed returning to work and pumping. Reviewed importance of draining breast regularly to support lactation.  -Social determinants of health (SDOH) reviewed in EPIC. No concerns   3. Sexuality, contraception and birth spacing -  Patient does not want a pregnancy in the next year.   - Reviewed reproductive life planning. Reviewed contraceptive methods based on pt preferences and effectiveness.  Patient desired FAM or LAM today.  Pt currently using abstinence with husband out of the country.  --Pt desires at least one more pregnancy.   4. Sleep and fatigue -Encouraged family/partner/community support of 4 hrs of uninterrupted sleep to help with mood and fatigue  5. Physical Recovery  - Discussed patients delivery and complications. She describes her labor as mixed.  Induction and labor progressed well, but she had a lot of swelling and then a third degree tear with bleeding. She received 1 unit of PRBCs. She was overall not happy with how everything happened. She is doing well now. Pt encouraged to discuss birth story with friends/family, seek professional if needed.  Discussed things she may want to change in the future, ie who is in the room, decreasing number of providers/learners present, etc.   - Patient had a Vaginal problems after delivery including third degree laceration and blood loss . Patient had a 3rd degree laceration. Perineal healing reviewed. Patient expressed understanding - Patient has urinary incontinence? No. - Patient is safe to resume physical and sexual activity  6.  Health Maintenance - HM due items addressed Yes - Last pap smear 2023 with [facility] Pap smear not done at today's visit.  -Breast Cancer screening indicated? No.   7. Chronic Disease/Pregnancy Condition follow up: None  - PCP follow up  Fatima Blank, [place] for [place]

## 2022-01-17 LAB — CBC
Hematocrit: 43.8 % (ref 34.0–46.6)
Hemoglobin: 14.7 g/dL (ref 11.1–15.9)
MCH: 30.4 pg (ref 26.6–33.0)
MCHC: 33.6 g/dL (ref 31.5–35.7)
MCV: 91 fL (ref 79–97)
Platelets: 222 10*3/uL (ref 150–450)
RBC: 4.83 x10E6/uL (ref 3.77–5.28)
RDW: 14.9 % (ref 11.7–15.4)
WBC: 4.7 10*3/uL (ref 3.4–10.8)

## 2022-01-17 LAB — TSH: TSH: 3.2 u[IU]/mL (ref 0.450–4.500)

## 2022-01-23 NOTE — Telephone Encounter (Signed)
Error

## 2022-03-19 ENCOUNTER — Ambulatory Visit: Payer: Medicaid Other | Admitting: Advanced Practice Midwife

## 2022-04-14 ENCOUNTER — Other Ambulatory Visit (HOSPITAL_COMMUNITY): Payer: Self-pay

## 2022-07-13 ENCOUNTER — Other Ambulatory Visit (HOSPITAL_COMMUNITY): Payer: Self-pay

## 2022-07-13 MED ORDER — AZITHROMYCIN 250 MG PO TABS
ORAL_TABLET | ORAL | 1 refills | Status: DC
  Filled 2022-07-13 (×2): qty 6, 5d supply, fill #0

## 2022-07-13 MED ORDER — PROMETHAZINE HCL 6.25 MG/5ML PO SYRP
12.5000 mg | ORAL_SOLUTION | ORAL | 1 refills | Status: DC
  Filled 2022-07-13: qty 120, 3d supply, fill #0

## 2022-07-26 ENCOUNTER — Other Ambulatory Visit (HOSPITAL_COMMUNITY): Payer: Self-pay

## 2023-06-14 ENCOUNTER — Other Ambulatory Visit (HOSPITAL_COMMUNITY): Payer: Self-pay

## 2023-06-14 MED ORDER — FLUCONAZOLE 100 MG PO TABS
100.0000 mg | ORAL_TABLET | Freq: Every day | ORAL | 1 refills | Status: AC
  Filled 2023-06-14: qty 7, 7d supply, fill #0

## 2023-06-15 ENCOUNTER — Ambulatory Visit
Admission: EM | Admit: 2023-06-15 | Discharge: 2023-06-15 | Disposition: A | Discharge status: Home / Self Care | Attending: Internal Medicine | Admitting: Internal Medicine

## 2023-06-15 ENCOUNTER — Emergency Department (HOSPITAL_BASED_OUTPATIENT_CLINIC_OR_DEPARTMENT_OTHER)
Admission: EM | Admit: 2023-06-15 | Discharge: 2023-06-15 | Disposition: A | Discharge status: Home / Self Care | Attending: Emergency Medicine | Admitting: Emergency Medicine

## 2023-06-15 ENCOUNTER — Other Ambulatory Visit: Payer: Self-pay

## 2023-06-15 ENCOUNTER — Encounter: Payer: Self-pay | Admitting: Emergency Medicine

## 2023-06-15 ENCOUNTER — Emergency Department (HOSPITAL_BASED_OUTPATIENT_CLINIC_OR_DEPARTMENT_OTHER)

## 2023-06-15 DIAGNOSIS — R002 Palpitations: Secondary | ICD-10-CM

## 2023-06-15 DIAGNOSIS — Z91040 Latex allergy status: Secondary | ICD-10-CM | POA: Insufficient documentation

## 2023-06-15 DIAGNOSIS — R0789 Other chest pain: Secondary | ICD-10-CM | POA: Diagnosis present

## 2023-06-15 DIAGNOSIS — R197 Diarrhea, unspecified: Secondary | ICD-10-CM | POA: Insufficient documentation

## 2023-06-15 DIAGNOSIS — R000 Tachycardia, unspecified: Secondary | ICD-10-CM | POA: Diagnosis not present

## 2023-06-15 DIAGNOSIS — R079 Chest pain, unspecified: Secondary | ICD-10-CM

## 2023-06-15 LAB — BASIC METABOLIC PANEL
Anion gap: 7 (ref 5–15)
BUN: 5 mg/dL — ABNORMAL LOW (ref 6–20)
CO2: 27 mmol/L (ref 22–32)
Calcium: 9.3 mg/dL (ref 8.9–10.3)
Chloride: 101 mmol/L (ref 98–111)
Creatinine, Ser: 0.54 mg/dL (ref 0.44–1.00)
GFR, Estimated: >60 mL/min (ref >60)
Glucose, Bld: 136 mg/dL — ABNORMAL HIGH (ref 70–99)
Potassium: 3.5 mmol/L (ref 3.5–5.1)
Sodium: 135 mmol/L (ref 135–145)

## 2023-06-15 LAB — TROPONIN I (HIGH SENSITIVITY)
Troponin I (High Sensitivity): <2 ng/L (ref <18)
Troponin I (High Sensitivity): <2 ng/L (ref <18)

## 2023-06-15 LAB — CBC
HCT: 42.9 % (ref 36.0–46.0)
Hemoglobin: 14.9 g/dL (ref 12.0–15.0)
MCH: 31.6 pg (ref 26.0–34.0)
MCHC: 34.7 g/dL (ref 30.0–36.0)
MCV: 91.1 fL (ref 80.0–100.0)
Platelets: 202 10*3/uL (ref 150–400)
RBC: 4.71 MIL/uL (ref 3.87–5.11)
RDW: 12 % (ref 11.5–15.5)
WBC: 5.9 10*3/uL (ref 4.0–10.5)
nRBC: 0 % (ref 0.0–0.2)

## 2023-06-15 LAB — D-DIMER, QUANTITATIVE: D-Dimer, Quant: <0.27 ug{FEU}/mL (ref 0.00–0.50)

## 2023-06-15 LAB — PREGNANCY, URINE: Preg Test, Ur: NEGATIVE

## 2023-06-15 MED ORDER — LACTATED RINGERS IV BOLUS
1000.0000 mL | Freq: Once | INTRAVENOUS | Status: AC
  Administered 2023-06-15: 1000 mL via INTRAVENOUS

## 2023-06-15 NOTE — ED Triage Notes (Signed)
 Pt says palpitations started earlier this week. Says she felt the chest pain and palpations last night. This morning it was more of a tightness. Pt denies dizziness.

## 2023-06-15 NOTE — ED Provider Notes (Signed)
 3:07 PM Patient signed out to me by previous ED physician. Pt is a 38 yo female w/o hx of cardiac abnormalities presenting for chest pain.   Ecg stable. Initial trop stable. Repeat trop and cxr pending. Dimer neg. Consider antacids?   Physical Exam  BP 125/70 (BP Location: Left Arm)   Pulse (!) 106   Temp 97.8 F (36.6 C) (Oral)   Resp 20   Ht 5\' 5"  (1.651 m)   Wt 73.5 kg   LMP 05/21/2023 (Approximate)   SpO2 100%   Breastfeeding No   BMI 26.96 kg/m   Physical Exam  Procedures  Procedures  ED Course / MDM   Clinical Course as of 06/15/23 1634  Sat Jun 15, 2023  1529 Initial labs show normal CBC and metabolic panel.  D-dimer negative.  Troponin negative.  Pregnancy test negative. [JK]    Clinical Course User Index [JK] Linwood Dibbles, MD   Medical Decision Making Amount and/or Complexity of Data Reviewed Labs: ordered. Radiology: ordered.  The patient's chest pain is not suggestive of pulmonary embolus, cardiac ischemia, aortic dissection, pericarditis, myocarditis, pulmonary embolism, pneumothorax, pneumonia, Zoster, or esophageal perforation, or other serious etiology.  Historically not abrupt in onset, tearing or ripping, pulses symmetric. EKG nonspecific for ischemia/infarction. No dysrhythmias, brugada, WPW, prolonged QT noted. CXR reviewed and WNL. Troponin negative x2. CXR reviewed.   Labs without demonstration of acute pathology unless otherwise noted above. Low HEART Score: 0-3 points (0.9-1.7% risk of MACE).]   Given the extremely low risk of these diagnoses further testing and evaluation for these possibilities does not appear to be indicated at this time.   Patient in no distress and overall condition improved here in the ED. Detailed discussions were had with the patient regarding current findings, and need for close f/u with PCP or on call doctor. The patient has been instructed to return immediately if the symptoms worsen in any way for re-evaluation. Patient  verbalized understanding and is in agreement with current care plan. All questions answered prior to discharge.        Edwin Dada P, DO 06/15/23 1635

## 2023-06-15 NOTE — ED Provider Notes (Signed)
 [facility] Provider Note   CSN: [id number] Arrival date & time: 06/15/23  1320     History  Chief Complaint  Patient presents with   Chest Pain    Sandra Taylor is a 38 y.o. female.   Chest Pain  Pt started having some chest pressure a few days ago but then increased yesterday. Last night she had more symptoms last night.  She felt a pumping sensation in her chest.   Also developed some diarrhea the last week prior but that resolved by Monday.  Pt was able to hydrate on Tuesday.  Pt has been fasting over the past week.  No blood in stool.    Home Medications Prior to Admission medications   Medication Sig Start Date End Date Taking? Authorizing Provider  acetaminophen (TYLENOL) 325 MG tablet Take 2 tablets (650 mg total) by mouth every 4 (four) hours as needed (for pain scale < 4). 11/22/21   Mercado-Ortiz, Lahoma Crocker, DO  azithromycin (ZITHROMAX Z-PAK) 250 MG tablet Take 2 tablets by mouth  on day 1, then take one tablet daily for 4 days 07/13/22     benzocaine-Menthol (DERMOPLAST) 20-0.5 % AERO Apply 1 Application topically as needed for irritation (perineal discomfort). Patient not taking: Reported on 01/16/2022 11/22/21   Mercado-Ortiz, Lahoma Crocker, DO  famotidine (PEPCID) 20 MG tablet Take 1 tablet (20 mg total) by mouth 2 (two) times daily. Patient not taking: Reported on 01/16/2022 01/10/22   Hermina Staggers, MD  ferrous sulfate 325 (65 FE) MG tablet Take 1 tablet (325 mg total) by mouth every other day. Patient not taking: Reported on 12/20/2021 11/23/21 12/23/21  Mercado-Ortiz, Lahoma Crocker, DO  fluconazole (DIFLUCAN) 100 MG tablet Take 1 tablet (100 mg total) by mouth daily for 7 days. 06/13/23 06/21/23    ibuprofen (ADVIL) 600 MG tablet Take 1 tablet (600 mg total) by mouth every 6 (six) hours. 11/22/21   Mercado-Ortiz, Lahoma Crocker, DO  Prenatal Vit-Fe Fumarate-FA (MULTIVITAMIN-PRENATAL) 27-0.8 MG TABS tablet Take 1 tablet by mouth daily  at 12 noon.    [provider]  promethazine (PHENERGAN) 6.25 MG/5ML syrup Take 10 mLs (12.5 mg total) by mouth every 4-6 hours.  as needed for cough and nausea 07/13/22         Allergies    Latex and Pork-derived products    Review of Systems   Review of Systems  Cardiovascular:  Positive for chest pain.    Physical Exam Updated Vital Signs BP 125/70 (BP Location: Left Arm)   Pulse (!) 106   Temp 97.8 F (36.6 C) (Oral)   Resp 20   Ht 1.651 m (5\' 5" )   Wt 73.5 kg   LMP 05/21/2023 (Approximate)   SpO2 100%   Breastfeeding No   BMI 26.96 kg/m  Physical Exam Vitals and nursing note reviewed.  Constitutional:      General: She is not in acute distress.    Appearance: She is well-developed.  HENT:     Head: Normocephalic and atraumatic.     Right Ear: External ear normal.     Left Ear: External ear normal.  Eyes:     General: No scleral icterus.       Right eye: No discharge.        Left eye: No discharge.     Conjunctiva/sclera: Conjunctivae normal.  Neck:     Trachea: No tracheal deviation.  Cardiovascular:     Rate and Rhythm: Normal  rate and regular rhythm.  Pulmonary:     Effort: Pulmonary effort is normal. No respiratory distress.     Breath sounds: Normal breath sounds. No stridor. No wheezing or rales.  Abdominal:     General: Bowel sounds are normal. There is no distension.     Palpations: Abdomen is soft.     Tenderness: There is no abdominal tenderness. There is no guarding or rebound.  Musculoskeletal:        General: No tenderness or deformity.     Cervical back: Neck supple.  Skin:    General: Skin is warm and dry.     Findings: No rash.  Neurological:     General: No focal deficit present.     Mental Status: She is alert.     Cranial Nerves: No cranial nerve deficit, dysarthria or facial asymmetry.     Sensory: No sensory deficit.     Motor: No abnormal muscle tone or seizure activity.     Coordination: Coordination normal.   Psychiatric:        Mood and Affect: Mood normal.     ED Results / Procedures / Treatments   Labs (all labs ordered are listed, but only abnormal results are displayed) Labs Reviewed  BASIC METABOLIC PANEL - Abnormal; Notable for the following components:      Result Value   Glucose, Bld 136 (*)    BUN 5 (*)    All other components within normal limits  CBC  PREGNANCY, URINE  D-DIMER, QUANTITATIVE  TROPONIN I (HIGH SENSITIVITY)  TROPONIN I (HIGH SENSITIVITY)    EKG EKG Interpretation Date/Time:  Saturday June 15 2023 13:28:35 EST Ventricular Rate:  114 PR Interval:  168 QRS Duration:  84 QT Interval:  300 QTC Calculation: 414 R Axis:   81  Text Interpretation: Sinus tachycardia Right atrial enlargement Borderline T abnormalities, inferior leads No significant change since last tracing Reconfirmed by Linwood Dibbles [phone]) on 06/15/2023 2:17:02 PM  Radiology No results found.  Procedures Procedures    Medications Ordered in ED Medications  lactated ringers bolus 1,000 mL (1,000 mLs Intravenous New Bag/Given 06/15/23 1435)    ED Course/ Medical Decision Making/ A&P Clinical Course as of 06/15/23 1530  Sat Jun 15, 2023  1529 Initial labs show normal CBC and metabolic panel.  D-dimer negative.  Troponin negative.  Pregnancy test negative. [JK]    Clinical Course User Index [JK] Linwood Dibbles, MD                                 Medical Decision Making Differential diagnosis occludes but not limited to pneumonia pulmonary embolism pneumothorax, myocarditis acute coronary syndrome  Amount and/or Complexity of Data Reviewed Labs: ordered. Decision-making details documented in ED Course. Radiology: ordered.   Patient presented to ED with complaints of chest pain.  No significant risk factors for heart disease or pulmonary embolism.  Noted to be tachycardic initially but D-dimer is negative.  Low suspicion for PE at this time.  Initial troponin normal.  Will plan on  second troponin.  Chest x-ray is also pending.  Care turned over to Dr. Wallace Cullens at shift change.  Will plan on the delta troponin as well as a chest x-ray.  Anticipate if those are negative patient will likely be stable for discharge        Final Clinical Impression(s) / ED Diagnoses Final diagnoses:  None    Rx /  DC Orders ED Discharge Orders     None         Linwood Dibbles, MD 06/15/23 1530

## 2023-06-15 NOTE — ED Notes (Signed)
 Patient is being discharged from the Urgent Care and sent to the Emergency Department via POV with mom. Per [place], NP, patient is in need of higher level of care due to palpitations. Patient is aware and verbalizes understanding of plan of care.  Vitals:   06/15/23 1153  BP: 112/75  Pulse: (!) 105  Resp: 18  Temp: 98 F (36.7 C)  SpO2: 98%

## 2023-06-15 NOTE — ED Notes (Signed)
 Pt alert and oriented X 4 at the time of discharge. RR even and unlabored. No acute distress noted. Pt verbalized understanding of discharge instructions as discussed. Pt ambulatory to lobby at time of discharge.

## 2023-06-15 NOTE — ED Provider Notes (Addendum)
 [facility]    CSN: [id number] Arrival date & time: 06/15/23  1134      History   Chief Complaint Chief Complaint  Patient presents with   Chest Pain   Palpitations    HPI Sandra Taylor is a 38 y.o. female.   Patient presents with concerns of palpitations that started about 5 days ago.  Reports that they have become more persistent and severe over the past 24 to 48 hours.  Reports that she has occasional chest pain in the center of her chest when it occurs.  Denies any associated headache, dizziness, blurred vision, nausea, vomiting.  Denies any previous history of palpitations, tachycardia, heart problems.  Denies any recent harsh coughing.  Denies any fever.  Has taken Tylenol for the chest pain with improvement.  Patient states that she has been having intermittent diarrhea after fasting over the past few days so is not sure if this is related.  He has been able to tolerate oral fluids.   Chest Pain Palpitations   Past Medical History:  Diagnosis Date   Family history of adverse reaction to anesthesia    mom but uncertain what   Medical history non-contributory    Nonspecific reaction to tuberculin skin test without active tuberculosis(795.51) 05/26/2008   Qualifier: Diagnosis of  By: Lanier Prude  MD, Taineisha      Patient Active Problem List   Diagnosis Date Noted   Third degree laceration of perineum, type 3a 11/21/2021    Past Surgical History:  Procedure Laterality Date   CYSTECTOMY     NO PAST SURGERIES      OB History     Gravida  1   Para  1   Term  1   Preterm      AB      Living  1      SAB      IAB      Ectopic      Multiple  0   Live Births  1            Home Medications    Prior to Admission medications   Medication Sig Start Date End Date Taking? Authorizing Provider  acetaminophen (TYLENOL) 325 MG tablet Take 2 tablets (650 mg total) by mouth every 4 (four) hours as needed (for pain scale < 4). 11/22/21    Mercado-Ortiz, Lahoma Crocker, DO  azithromycin (ZITHROMAX Z-PAK) 250 MG tablet Take 2 tablets by mouth  on day 1, then take one tablet daily for 4 days 07/13/22     benzocaine-Menthol (DERMOPLAST) 20-0.5 % AERO Apply 1 Application topically as needed for irritation (perineal discomfort). Patient not taking: Reported on 01/16/2022 11/22/21   Mercado-Ortiz, Lahoma Crocker, DO  famotidine (PEPCID) 20 MG tablet Take 1 tablet (20 mg total) by mouth 2 (two) times daily. Patient not taking: Reported on 01/16/2022 01/10/22   Hermina Staggers, MD  ferrous sulfate 325 (65 FE) MG tablet Take 1 tablet (325 mg total) by mouth every other day. Patient not taking: Reported on 12/20/2021 11/23/21 12/23/21  Mercado-Ortiz, Lahoma Crocker, DO  fluconazole (DIFLUCAN) 100 MG tablet Take 1 tablet (100 mg total) by mouth daily for 7 days. 06/13/23 06/21/23    ibuprofen (ADVIL) 600 MG tablet Take 1 tablet (600 mg total) by mouth every 6 (six) hours. 11/22/21   Mercado-Ortiz, Lahoma Crocker, DO  Prenatal Vit-Fe Fumarate-FA (MULTIVITAMIN-PRENATAL) 27-0.8 MG TABS tablet Take 1 tablet by mouth daily at 12 noon.    [provider]  promethazine (PHENERGAN) 6.25 MG/5ML syrup Take 10 mLs (12.5 mg total) by mouth every 4-6 hours.  as needed for cough and nausea 07/13/22       Family History Family History  Problem Relation Age of Onset   Diabetes Mother    Hypertension Mother    Diabetes Father        borderline    Social History Social History   Tobacco Use   Smoking status: Never   Smokeless tobacco: Never  Vaping Use   Vaping status: Never Used  Substance Use Topics   Alcohol use: No   Drug use: No     Allergies   Latex and Pork-derived products   Review of Systems Review of Systems Per HPI  Physical Exam Triage Vital Signs ED Triage Vitals  Encounter Vitals Group     BP 06/15/23 1153 112/75     Systolic BP Percentile --      Diastolic BP Percentile --      Pulse Rate 06/15/23 1153 (!) 105     Resp 06/15/23 1153 18      Temp 06/15/23 1153 98 F (36.7 C)     Temp Source 06/15/23 1153 Oral     SpO2 06/15/23 1153 98 %     Weight --      Height --      Head Circumference --      Peak Flow --      Pain Score 06/15/23 1151 4     Pain Loc --      Pain Education --      Exclude from Growth Chart --    No data found.  Updated Vital Signs BP 112/75 (BP Location: Left Arm)   Pulse (!) 105   Temp 98 F (36.7 C) (Oral)   Resp 18   LMP 05/21/2023 (Approximate)   SpO2 98%   Breastfeeding No   Visual Acuity Right Eye Distance:   Left Eye Distance:   Bilateral Distance:    Right Eye Near:   Left Eye Near:    Bilateral Near:     Physical Exam Constitutional:      General: She is not in acute distress.    Appearance: Normal appearance. She is not toxic-appearing or diaphoretic.  HENT:     Head: Normocephalic and atraumatic.  Eyes:     Extraocular Movements: Extraocular movements intact.     Conjunctiva/sclera: Conjunctivae normal.  Cardiovascular:     Rate and Rhythm: Regular rhythm. Tachycardia present.     Pulses: Normal pulses.     Heart sounds: Normal heart sounds.  Pulmonary:     Effort: Pulmonary effort is normal. No respiratory distress.     Breath sounds: Normal breath sounds.  Neurological:     General: No focal deficit present.     Mental Status: She is alert and oriented to person, place, and time. Mental status is at baseline.  Psychiatric:        Mood and Affect: Mood normal.        Behavior: Behavior normal.        Thought Content: Thought content normal.        Judgment: Judgment normal.      UC Treatments / Results  Labs (all labs ordered are listed, but only abnormal results are displayed) Labs Reviewed - No data to display  EKG   Radiology No results found.  Procedures Procedures (including critical care time)  Medications Ordered in UC Medications - No  data to display  Initial Impression / Assessment and Plan / UC Course  I have reviewed the triage  vital signs and the nursing notes.  Pertinent labs & imaging results that were available during my care of the patient were reviewed by me and considered in my medical decision making (see chart for details).     Differential diagnoses include dehydration versus electrolyte abnormality versus anxiety. EKG shows sinus tachycardia. I do think the patient possibly needs IV fluids and stat blood work which cannot be provided here at urgent care so the patient was advised to go to the ER for further evaluation and management.  Patient was agreeable with plan.  Vital signs stable at discharge.  Agree with patient self transport to the ER. Final Clinical Impressions(s) / UC Diagnoses   Final diagnoses:  Palpitations  Tachycardia     Discharge Instructions      Please go to the emergency department as soon as you leave urgent care for further evaluation and management.    ED Prescriptions   None    PDMP not reviewed this encounter.   Gustavus Bryant, [place] 06/15/23 1227    Gustavus Bryant, [place] 06/15/23 1228

## 2023-06-15 NOTE — Discharge Instructions (Signed)
Please go to the emergency department as soon as you leave urgent care for further evaluation and management. ?

## 2023-06-15 NOTE — ED Triage Notes (Signed)
 Pt POV steady gait- c/o chest tightness, palpitations x2 days. Worsening yesterday.  Denies n/v  Diarrhea last  Monday, resolved and then had diarrhea x4-5 again today, denies blood.   Tylenol taken appx 0830, some relief.

## 2023-06-20 ENCOUNTER — Other Ambulatory Visit (HOSPITAL_COMMUNITY): Payer: Self-pay

## 2023-06-20 MED ORDER — TALICIA 250-12.5-10 MG PO CPDR
4.0000 | DELAYED_RELEASE_CAPSULE | Freq: Three times a day (TID) | ORAL | 0 refills | Status: DC
  Filled 2023-06-20: qty 168, 14d supply, fill #0

## 2023-07-12 ENCOUNTER — Ambulatory Visit: Admitting: Internal Medicine

## 2023-11-07 ENCOUNTER — Other Ambulatory Visit (HOSPITAL_COMMUNITY): Payer: Self-pay

## 2023-11-07 MED ORDER — FLUCONAZOLE 100 MG PO TABS
100.0000 mg | ORAL_TABLET | Freq: Every day | ORAL | 1 refills | Status: DC
  Filled 2023-11-07: qty 7, 7d supply, fill #0

## 2023-11-18 ENCOUNTER — Other Ambulatory Visit (HOSPITAL_COMMUNITY): Payer: Self-pay

## 2023-12-31 ENCOUNTER — Other Ambulatory Visit (HOSPITAL_COMMUNITY)
Admission: RE | Admit: 2023-12-31 | Discharge: 2023-12-31 | Disposition: A | Source: Ambulatory Visit | Attending: Advanced Practice Midwife | Admitting: Advanced Practice Midwife

## 2023-12-31 ENCOUNTER — Ambulatory Visit (INDEPENDENT_AMBULATORY_CARE_PROVIDER_SITE_OTHER): Admitting: Advanced Practice Midwife

## 2023-12-31 ENCOUNTER — Encounter: Payer: Self-pay | Admitting: Advanced Practice Midwife

## 2023-12-31 VITALS — BP 119/78 | HR 78 | Ht 65.0 in | Wt 163.0 lb

## 2023-12-31 DIAGNOSIS — Z124 Encounter for screening for malignant neoplasm of cervix: Secondary | ICD-10-CM

## 2023-12-31 DIAGNOSIS — Z01419 Encounter for gynecological examination (general) (routine) without abnormal findings: Secondary | ICD-10-CM | POA: Diagnosis not present

## 2023-12-31 DIAGNOSIS — N841 Polyp of cervix uteri: Secondary | ICD-10-CM

## 2023-12-31 DIAGNOSIS — Z3169 Encounter for other general counseling and advice on procreation: Secondary | ICD-10-CM

## 2023-12-31 MED ORDER — PRENATAL 27-0.8 MG PO TABS: 1.0000 | ORAL_TABLET | Freq: Every day | ORAL | 2 refills | Status: AC

## 2023-12-31 NOTE — Progress Notes (Signed)
 Subjective:     Sandra Taylor is a 38 y.o. female here at [facility] for a routine exam.  Current complaints: desires pregnancy, just starting trying last month. Menses due in 5 days, will take UPT if menses are late.  Normal Pap hx but hx of polyps with removal. Desires Pap today.  Personal and family health history reviewed: yes.  Do you have a primary care provider? yes   Flowsheet Row Office Visit from 12/31/2023 in [facility] for [facility] Total Score 0    Health Maintenance Due  Topic Date Due   Hepatitis C Screening  Never done   Hepatitis B Vaccines 19-59 Average Risk (1 of 3 - 19+ 3-dose series) Never done   HPV VACCINES (1 - 3-dose SCDM series) Never done   Cervical Cancer Screening (HPV/Pap Cotest)  12/25/2015   Influenza Vaccine  11/08/2023   COVID-19 Vaccine (3 - 2025-26 season) 12/09/2023     Risk factors for chronic health problems: Smoking: No Alchohol/how much: Pt BMI: Body mass index is 27.12 kg/m.   Gynecologic History Patient's last menstrual period was 12/11/2023. Contraception: none Last Pap: 2022. Results were: normal Last mammogram: n/a.   Obstetric History OB History  Gravida Para Term Preterm AB Living  1 1 1   1   SAB IAB Ectopic Multiple Live Births     0 1    # Outcome Date GA Lbr Len/2nd Weight Sex Type Anes PTL Lv  1 Term 11/20/21 [redacted]w[redacted]d 03:42 / 05:02 7 lb 10.4 oz (3.47 kg) F Vag-Spont EPI  LIV     The following portions of the patient's history were reviewed and updated as appropriate: allergies, current medications, past family history, past medical history, past social history, past surgical history, and problem list.  Review of Systems Pertinent items noted in HPI and remainder of comprehensive ROS otherwise negative.    Objective:   Today's Vitals   12/31/23 1116  BP: 119/78  Pulse: 78  Weight: 163 lb (73.9 kg)  Height: 5' 5 (1.651 m)   Body mass index is 27.12 kg/m.  VS reviewed,  nursing note reviewed,  Constitutional: well developed, well nourished, no distress HEENT: normocephalic, thyroid  without enlargement or mass HEART: RRR, no murmurs rubs/gallops RESP: clear and equal to auscultation bilaterally in all lobes  Breast Exam:  exam performed: right breast normal without mass, skin or nipple changes or axillary nodes, left breast normal without mass, skin or nipple changes or axillary nodes Abdomen: soft Neuro: alert and oriented x 3 Skin: warm, dry Psych: affect normal Pelvic exam: Performed: Cervix pink, visually closed, beefy red pedunculated mass protruding from cervical os, ~ 2 cm in length, scant white creamy discharge, vaginal walls and external genitalia normal Bimanual exam: Cervix 0/long/high, firm, anterior, neg CMT, uterus nontender, nonenlarged, adnexa without tenderness, enlargement, or mass        Assessment/Plan:  1. Encounter for annual routine gynecological examination (Primary)   2. Pre-conception counseling --Pt with regular menses, husband just arrived in August with permanent green card so just started trying.      3. Cervical polyp --Beefy red pedunculated polyp visible from cervical os, around 2 cm in length from os.   --MD not available to come in to visit today, so will reschedule with MD for polyp removal/biopsy PRN --Pt with hx polypectomy in previous exams    4. Encounter for screening for cervical cancer  - Cytology - PAP( )  Return for MD only.   Olam Boards, CNM 12:44 PM

## 2023-12-31 NOTE — Progress Notes (Signed)
 Pt presents for AEX  Last PAP 2022, No Hx of abnormal PAP but has had polyps  Requesting PAP today Declines BC and STD testing   Pt has been trying to conceive since August.

## 2024-01-02 LAB — CYTOLOGY - PAP
Comment: NEGATIVE
Diagnosis: NEGATIVE
High risk HPV: NEGATIVE

## 2024-01-12 ENCOUNTER — Encounter (HOSPITAL_COMMUNITY): Payer: Self-pay | Admitting: Obstetrics and Gynecology

## 2024-01-12 ENCOUNTER — Inpatient Hospital Stay (HOSPITAL_COMMUNITY)
Admission: AD | Admit: 2024-01-12 | Discharge: 2024-01-12 | Disposition: A | Discharge status: Home / Self Care | Attending: Obstetrics and Gynecology | Admitting: Obstetrics and Gynecology

## 2024-01-12 ENCOUNTER — Other Ambulatory Visit: Payer: Self-pay

## 2024-01-12 DIAGNOSIS — Z3202 Encounter for pregnancy test, result negative: Secondary | ICD-10-CM | POA: Insufficient documentation

## 2024-01-12 DIAGNOSIS — N939 Abnormal uterine and vaginal bleeding, unspecified: Secondary | ICD-10-CM

## 2024-01-12 DIAGNOSIS — O3680X0 Pregnancy with inconclusive fetal viability, not applicable or unspecified: Secondary | ICD-10-CM

## 2024-01-12 LAB — POCT PREGNANCY, URINE: Preg Test, Ur: NEGATIVE

## 2024-01-12 LAB — HCG, QUANTITATIVE, PREGNANCY: hCG, Beta Chain, Quant, S: 7 m[IU]/mL — ABNORMAL HIGH (ref <5)

## 2024-01-12 NOTE — MAU Provider Note (Signed)
 Event Date/Time   First Provider Initiated Contact with Patient 01/12/24 1653      S Ms. Sandra Taylor is a 38 y.o. G1P1001 patient who presents to MAU today with complaint of 2 (+) HPT; on 9/24 & 9/25.    O BP 124/74 (BP Location: Right Arm)   Pulse 90   Temp 98 F (36.7 C) (Oral)   Resp 16   Ht 5' 5 (1.651 m)   Wt 73.5 kg   LMP 12/11/2023   SpO2 100%   BMI 26.96 kg/m   Physical Exam Vitals and nursing note reviewed.  Constitutional:      Appearance: Normal appearance. She is normal weight.  Cardiovascular:     Rate and Rhythm: Normal rate.  Pulmonary:     Effort: Pulmonary effort is normal.  Musculoskeletal:        General: Normal range of motion.  Neurological:     Mental Status: She is alert and oriented to person, place, and time.  Psychiatric:        Mood and Affect: Mood normal.        Behavior: Behavior normal.        Thought Content: Thought content normal.        Judgment: Judgment normal.    Results for orders placed or performed during the hospital encounter of 01/12/24 (from the past 24 hours)  Pregnancy, urine POC     Status: None   Collection Time: 01/12/24  4:24 PM  Result Value Ref Range   Preg Test, Ur NEGATIVE NEGATIVE  hCG, quantitative, pregnancy     Status: Abnormal   Collection Time: 01/12/24  4:51 PM  Result Value Ref Range   hCG, Beta Chain, Quant, S 7 (H) <5 mIU/mL    A Medical screening exam complete 1. Urine pregnancy test negative (Primary)  2. Vaginal bleeding  3. Pregnancy of unknown anatomic location    P Discharge from MAU in stable condition Sent plan for follow-up via MyChart messaging per patient request: repeat HCG on Tuesday 01/14/2024 @ 1300 at [facility] Warning signs for worsening condition that would warrant emergency follow-up discussed Patient may return to MAU as needed for pregnancy related issues  Letha Renshaw, CNM 01/12/2024 4:53 PM

## 2024-01-12 NOTE — MAU Note (Signed)
 Sandra Taylor is a 38 y.o. at Unknown here in MAU reporting: +HPT 01/02/24  Vaginal bleeding started yesterday at 0700 that was moderate bleeding and has progressed into today that is heavier along with clots. States she is wearing a pad currently and there is not much on her pad currently and feels like it has slowed down. Reports lower abdominal cramping and back pain. Took tylenol  and had some relief but the cramping pain came back.  cervical polyp present at her OB appointment 12/31/23 and has a procedure scheduled on October 15th. States she has had cervical polyps before and they have come back benign.  LMP: 12/10/23 Onset of complaint: yesterday Pain score: 4 Vitals:   01/12/24 1636  BP: 124/74  Pulse: 90  Resp: 16  Temp: 98 F (36.7 C)  SpO2: 100%     FHT:n/a Lab orders placed from triage:  pregnancy test

## 2024-01-14 ENCOUNTER — Other Ambulatory Visit: Payer: Self-pay

## 2024-01-14 VITALS — BP 112/75 | HR 75

## 2024-01-14 DIAGNOSIS — N939 Abnormal uterine and vaginal bleeding, unspecified: Secondary | ICD-10-CM

## 2024-01-14 NOTE — Progress Notes (Signed)
 Pt presents for stat HCG for vaginal bleeding. Pt reports passing large clots and heavy bleeding on Sunday. HCG at MAU was 7, pt reports for repeat to ensure hormone has returned to normal. Informed pt we will have results back by end of day and will inform her of results.

## 2024-01-15 ENCOUNTER — Ambulatory Visit: Payer: Self-pay | Admitting: Obstetrics and Gynecology

## 2024-01-15 LAB — BETA HCG QUANT (REF LAB): hCG Quant: 2 m[IU]/mL

## 2024-01-22 ENCOUNTER — Other Ambulatory Visit (HOSPITAL_COMMUNITY)
Admission: RE | Admit: 2024-01-22 | Discharge: 2024-01-22 | Disposition: A | Discharge status: Home / Self Care | Source: Ambulatory Visit | Attending: Obstetrics & Gynecology | Admitting: Obstetrics & Gynecology

## 2024-01-22 ENCOUNTER — Ambulatory Visit: Admitting: Obstetrics & Gynecology

## 2024-01-22 VITALS — BP 117/80 | HR 85 | Wt 169.0 lb

## 2024-01-22 DIAGNOSIS — N841 Polyp of cervix uteri: Secondary | ICD-10-CM

## 2024-01-22 NOTE — Progress Notes (Signed)
 Patient ID: Sandra Taylor, female   DOB: Oct 15, 1985, 38 y.o.   MRN: [id number]  Chief Complaint  Patient presents with   Gynecologic Exam   Follow-up    HPI Sandra Taylor is a 38 y.o. female.  G1P1001 Patient's last menstrual period was 01/11/2024. She recently was followed for a positive home pregnancy test and HCG dropped to 2. H/O cervical polypectomy and she has a recurrence, here today for removal HPI  Past Medical History:  Diagnosis Date   Family history of adverse reaction to anesthesia    mom but uncertain what   Medical history non-contributory    Nonspecific reaction to tuberculin skin test without active tuberculosis(795.51) 05/26/2008   Qualifier: Diagnosis of  By: Lucille  MD, Preston      Past Surgical History:  Procedure Laterality Date   CYSTECTOMY     NO PAST SURGERIES      Family History  Problem Relation Age of Onset   Diabetes Mother    Hypertension Mother    Diabetes Father        borderline    Social History Social History   Tobacco Use   Smoking status: Never   Smokeless tobacco: Never  Vaping Use   Vaping status: Never Used  Substance Use Topics   Alcohol use: No   Drug use: No    Allergies  Allergen Reactions   Latex Itching    With gloves   Porcine (Pork) Protein-Containing Drug Products Other (See Comments)    Religious preference     Current Outpatient Medications  Medication Sig Dispense Refill   Prenatal Vit-Fe Fumarate-FA (MULTIVITAMIN-PRENATAL) 27-0.8 MG TABS tablet Take 1 tablet by mouth daily at 12 noon. 90 tablet 2   No current facility-administered medications for this visit.    Review of Systems Review of Systems  Constitutional: Negative.   Genitourinary:  Negative for menstrual problem, pelvic pain, vaginal bleeding and vaginal discharge.    Blood pressure 117/80, pulse 85, weight 169 lb (76.7 kg), last menstrual period 01/11/2024.  Physical Exam Physical Exam Vitals and nursing note reviewed. Exam  conducted with a chaperone present.  Constitutional:      Appearance: Normal appearance.  Genitourinary:    General: Normal vulva.     Exam position: Lithotomy position.     Vagina: Normal.     Cervix: Lesion present.        Comments: 1 cm benign polyp removed with ring forceps well tolerated Skin:    General: Skin is warm and dry.  Neurological:     Mental Status: She is alert.  Psychiatric:        Mood and Affect: Mood normal.        Behavior: Behavior normal.     Data Reviewed HCG results  Assessment Cervical polyp - Plan: Surgical pathology( [place]/ [facility])   Plan Plans to conceive. Path report will be reviewed    Lynwood Solomons 01/22/2024, 9:20 AM

## 2024-01-23 LAB — SURGICAL PATHOLOGY

## 2024-01-30 ENCOUNTER — Ambulatory Visit: Payer: Self-pay | Admitting: Obstetrics & Gynecology
